# Patient Record
Sex: Male | Born: 1945 | Race: White | Hispanic: No | Marital: Single | Smoking: Former smoker
Health system: Southern US, Community
[De-identification: ages and names within clinical notes are randomized; demographics above are authoritative.]

## PROBLEM LIST (undated history)

## (undated) DIAGNOSIS — R0600 Dyspnea, unspecified: Secondary | ICD-10-CM

## (undated) DIAGNOSIS — G4730 Sleep apnea, unspecified: Secondary | ICD-10-CM

## (undated) DIAGNOSIS — J449 Chronic obstructive pulmonary disease, unspecified: Secondary | ICD-10-CM

## (undated) DIAGNOSIS — E785 Hyperlipidemia, unspecified: Secondary | ICD-10-CM

## (undated) HISTORY — DX: Sleep apnea, unspecified: G47.30

## (undated) HISTORY — DX: Hyperlipidemia, unspecified: E78.5

## (undated) HISTORY — DX: Chronic obstructive pulmonary disease, unspecified: J44.9

## (undated) HISTORY — PX: SHOULDER ARTHROSCOPY: SHX128

## (undated) HISTORY — PX: LIMB SPARING RESECTION HIP W/ SADDLE JOINT REPLACEMENT: SUR829

## (undated) HISTORY — PX: HERNIA REPAIR: SHX51

---

## 2014-04-05 ENCOUNTER — Ambulatory Visit (INDEPENDENT_AMBULATORY_CARE_PROVIDER_SITE_OTHER): Payer: Medicare HMO | Admitting: Otolaryngology

## 2014-04-05 DIAGNOSIS — J342 Deviated nasal septum: Secondary | ICD-10-CM

## 2014-04-05 DIAGNOSIS — J343 Hypertrophy of nasal turbinates: Secondary | ICD-10-CM

## 2014-04-10 ENCOUNTER — Other Ambulatory Visit: Payer: Self-pay | Admitting: Otolaryngology

## 2014-04-24 ENCOUNTER — Encounter (HOSPITAL_BASED_OUTPATIENT_CLINIC_OR_DEPARTMENT_OTHER): Payer: Self-pay

## 2014-04-24 ENCOUNTER — Ambulatory Visit (HOSPITAL_BASED_OUTPATIENT_CLINIC_OR_DEPARTMENT_OTHER): Admit: 2014-04-24 | Payer: Medicare Other | Admitting: Otolaryngology

## 2014-04-24 SURGERY — SEPTOPLASTY, NOSE, WITH NASAL TURBINATE REDUCTION
Anesthesia: General | Laterality: Bilateral

## 2016-11-04 ENCOUNTER — Encounter (INDEPENDENT_AMBULATORY_CARE_PROVIDER_SITE_OTHER): Payer: Self-pay | Admitting: *Deleted

## 2016-11-27 ENCOUNTER — Other Ambulatory Visit (INDEPENDENT_AMBULATORY_CARE_PROVIDER_SITE_OTHER): Payer: Self-pay | Admitting: *Deleted

## 2016-11-27 ENCOUNTER — Encounter (INDEPENDENT_AMBULATORY_CARE_PROVIDER_SITE_OTHER): Payer: Self-pay | Admitting: *Deleted

## 2016-11-27 DIAGNOSIS — Z1211 Encounter for screening for malignant neoplasm of colon: Secondary | ICD-10-CM

## 2017-02-23 ENCOUNTER — Telehealth (INDEPENDENT_AMBULATORY_CARE_PROVIDER_SITE_OTHER): Payer: Self-pay | Admitting: *Deleted

## 2017-02-23 ENCOUNTER — Encounter (INDEPENDENT_AMBULATORY_CARE_PROVIDER_SITE_OTHER): Payer: Self-pay | Admitting: *Deleted

## 2017-02-23 DIAGNOSIS — Z1211 Encounter for screening for malignant neoplasm of colon: Secondary | ICD-10-CM

## 2017-02-23 NOTE — Telephone Encounter (Addendum)
Patient needs trilyte -- screening

## 2017-02-25 MED ORDER — PEG 3350-KCL-NA BICARB-NACL 420 G PO SOLR: 4000.0000 mL | Freq: Once | ORAL | 0 refills | Status: AC

## 2017-03-03 DIAGNOSIS — G894 Chronic pain syndrome: Secondary | ICD-10-CM | POA: Insufficient documentation

## 2017-03-03 NOTE — Progress Notes (Signed)
Patient's Name: Gregory Hardin  MRN: [id number]  Referring Provider: Joyice Faster, FNP  DOB: 1946/01/18  PCP: Joyice Faster, FNP  DOS: 03/04/2017  Note by: Gaspar Cola, MD  Service setting: Ambulatory outpatient  Specialty: Interventional Pain Management  Location: [facility] (AMB) Pain Management Facility  Visit type: Initial Patient Evaluation  Patient type: New Patient   Primary Reason(s) for Visit: Encounter for initial evaluation of one or more chronic problems (new to examiner) potentially causing chronic pain, and posing a threat to normal musculoskeletal function. (Level of risk: High) CC: Back Pain (lower)  HPI  Gregory Hardin is a 71 y.o. year old, male patient, who comes today to see [place] for the first time for an initial evaluation of his chronic pain. 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 Today he comes in for evaluation of his Back Pain (lower)  Pain Assessment: Location: Lower Back Radiating: hips bilaterally and down back of legs to foot- primarily right Onset: More than a month ago Duration: Chronic pain Quality: Numbness, Sore, Aching, Constant, Nagging Severity: 10-Worst pain ever/10 (self-reported pain score)   Note: Reported level is inconsistent with clinical observations. Clinically the patient looks like a 3/10 Information on the proper use of the pain scale provided to the patient today Effect on ADL: cant do what he needs to especially the last year- down 80% of things he used to do- cant walk long distances Timing: Constant Modifying factors: Tylenol-heating pad  Onset and Duration: Present longer than 3 months Cause of pain: other Severity: Getting worse, NAS-11 at its worse: 10/10, NAS-11 at its best: 5/10, NAS-11 now: 10/10 and NAS-11 on the average: 10/10 Timing: Morning, Afternoon, Night, During activity or exercise, After activity or exercise and After a period of immobility Aggravating Factors: Bending, Climbing, Kneeling, Lifiting, Motion, Prolonged sitting, Prolonged standing, Squatting, Stooping , Twisting, Walking, Walking uphill, Walking downhill and Working Alleviating Factors: Hot packs, Lying down and Warm showers or baths Associated Problems: Night-time cramps, Dizziness, Fatigue, Numbness, Sweating, Tingling and Weakness Quality of Pain: Aching, Annoying, Constant, Cramping, Disabling, Exhausting, Nagging and Uncomfortable Previous Examinations or Tests: X-rays Previous Treatments: The patient denies treatments  The patient comes into the clinics today for the first time for a chronic pain management evaluation. According to the patient the primary area of pain is that of the lower back with the right side being worse than the left. He denies any prior surgeries, nerve blocks, physical therapy, or any recent x-rays.  The second area of pain is described to be that of the lower extremity on the right side with pain going all the way down into his foot. He describes that the pain involves the entire foot. He also complains of numbness and weakness of the right lower extremity. The patient indicates having had a right replacement in the past. He denies any physical therapy, nerve  blocks,  joint injections, or recent x-rays.  The next area of pain is described to be that of the left shoulder. He indicates having had a mass removed from his left shoulder in 2017. The surgeon was from [place] in [place]. He denies any prior nerve blocks, joint injections, or x-rays except for having had the joint drained.  The fourth area of pain is described to be to the right knee. He indicates no prior knee surgery, joint injections, nerve blocks, physical therapy, or any recent x-rays.  Physical exam today was positive for bilateral hip pain during the [facility] maneuver. He had significantly decreased range of motion on the right side where he had his total hip replacement. In addition, the patient had a positive bilateral pain provocation on the SI joints with the provocative Patrick's maneuver. The maneuver demonstrated the right side to be worse than the left. In addition to this, the patient had a positive response to the hyperextension or rotation maneuver with pain on the lumbar facets, bilaterally.  Today I took the time to provide the patient with information regarding my pain practice. The patient was informed that my practice is divided into two sections: an interventional pain management section, as well as a completely separate and distinct medication management section. I explained that I have procedure days for my interventional therapies, and evaluation days for follow-ups and medication management. Because of the amount of documentation required during both, they are kept separated. This means that there is the possibility that he may be scheduled for a procedure on one day, and medication management the next. I have also informed him that because of staffing and facility limitations, I no longer take patients for medication management only. To illustrate the reasons for this, I gave the patient the example of surgeons, and how inappropriate it would be to refer a patient  to his/her care, just to write for the post-surgical antibiotics on a surgery done by a different surgeon.   Because interventional pain management is my board-certified specialty, the patient was informed that joining my practice means that they are open to any and all interventional therapies. I made it clear that this does not mean that they will be forced to have any procedures done. What this means is that I believe interventional therapies to be essential part of the diagnosis and proper management of chronic pain conditions. Therefore, patients not interested in these interventional alternatives will be better served under the care of a different practitioner.  The patient was also made aware of my Comprehensive Pain Management Safety Guidelines where by joining my practice, they limit all of their nerve blocks and joint injections to those done by our practice, for as long as we are retained to manage their care.   Historic Controlled Substance Pharmacotherapy Review  PMP and historical list of controlled substances: Oxycodone 5 mg and 10 mg; hydrocodone/APAP 5/325 Highest opioid analgesic regimen found: Oxycodone IR 10 mg 1 tablet by mouth every 6 hours (40 mg/day of oxycodone) (60 MME/Day) Most recent opioid analgesic: Oxycodone IR 10 mg 1 tablet by mouth every 6 hours (40 mg/day of oxycodone) (60 MME/Day) (last prescribed on 05/04/2016) Current opioid analgesics: None Highest recorded MME/day: 60 mg/day MME/day: 0 mg/day Medications: The patient did not bring the medication(s) to the appointment, as requested in our "New Patient Package" Pharmacodynamics: Desired effects: Analgesia: The patient reports >50% benefit. Reported improvement in function: The patient reports medication allows him to accomplish basic ADLs. Clinically meaningful improvement in  function (CMIF): Sustained CMIF goals met Perceived effectiveness: Described as relatively effective, allowing for increase in  activities of daily living (ADL) Undesirable effects: Side-effects or Adverse reactions: None reported Historical Monitoring: The patient  reports that he does not use drugs. List of all UDS Test(s): No results found for: MDMA, COCAINSCRNUR, PCPSCRNUR, PCPQUANT, CANNABQUANT, THCU, [place] List of all Serum Drug Screening Test(s):  No results found for: AMPHSCRSER, BARBSCRSER, BENZOSCRSER, COCAINSCRSER, PCPSCRSER, PCPQUANT, THCSCRSER, CANNABQUANT, OPIATESCRSER, OXYSCRSER, PROPOXSCRSER Historical Background Evaluation: [place] PDMP: Six (6) year initial data search conducted.             [place] Department of public safety, offender search: Editor, commissioning Information) Non-contributory Risk Assessment Profile: Aberrant behavior: None observed or detected today Risk factors for fatal opioid overdose: None identified today Fatal overdose hazard ratio (HR): Calculation deferred Non-fatal overdose hazard ratio (HR): Calculation deferred Risk of opioid abuse or dependence: 0.7-3.0% with doses ? 36 MME/day and 6.1-26% with doses ? 120 MME/day. Substance use disorder (SUD) risk level: Pending results of Medical Psychology Evaluation for SUD Opioid risk tool (ORT) (Total Score):    ORT Scoring interpretation table:  Score <3 = Low Risk for SUD  Score between 4-7 = Moderate Risk for SUD  Score >8 = High Risk for Opioid Abuse   PHQ-2 Depression Scale:  Total score: 0  PHQ-2 Scoring interpretation table: (Score and probability of major depressive disorder)  Score 0 = No depression  Score 1 = 15.4% Probability  Score 2 = 21.1% Probability  Score 3 = 38.4% Probability  Score 4 = 45.5% Probability  Score 5 = 56.4% Probability  Score 6 = 78.6% Probability   PHQ-9 Depression Scale:  Total score: 0  PHQ-9 Scoring interpretation table:  Score 0-4 = No depression  Score 5-9 = Mild depression  Score 10-14 = Moderate depression  Score 15-19 = Moderately severe depression  Score 20-27 = Severe depression (2.4 times  higher risk of SUD and 2.89 times higher risk of overuse)   Pharmacologic Plan: Pending ordered tests and/or consults            Initial impression: Pending review of available data and ordered tests.  Meds   Current Meds  Medication Sig  . acetaminophen (TYLENOL) 500 MG tablet Take 500 mg by mouth every 6 (six) hours as needed.  Marland Kitchen ADVAIR HFA 230-21 MCG/ACT inhaler   . albuterol (PROVENTIL) (2.5 MG/3ML) 0.083% nebulizer solution albuterol sulfate 2.5 mg/3 mL (0.083 %) solution for nebulization  . albuterol (VENTOLIN HFA) 108 (90 Base) MCG/ACT inhaler Ventolin HFA 90 mcg/actuation aerosol inhaler  . fluticasone (FLONASE) 50 MCG/ACT nasal spray fluticasone 50 mcg/actuation nasal spray,suspension  . naloxone (NARCAN) nasal spray 4 mg/0.1 mL Narcan 4 mg/actuation nasal spray  1 spray to 1 nare every 2 minutes (alternating nostrils) until awake./EMS arrives PRN opioid overdose  . pravastatin (PRAVACHOL) 80 MG tablet Take 1 tablet by mouth daily.  . ranitidine (ZANTAC) 150 MG tablet ranitidine 150 mg tablet    Imaging Review   Complexity Note: No results found under the [facility] record.               ROS  Cardiovascular History: No reported cardiovascular signs or symptoms such as High blood pressure, coronary artery disease, abnormal heart rate or rhythm, heart attack, blood thinner therapy or heart weakness and/or failure Pulmonary or Respiratory History: Lung problems, Wheezing and difficulty taking a deep full breath (Asthma), Difficulty blowing air out (Emphysema), Shortness of breath and  Temporary stoppage of breathing during sleep Neurological History: No reported neurological signs or symptoms such as seizures, abnormal skin sensations, urinary and/or fecal incontinence, being born with an abnormal open spine and/or a tethered spinal cord Review of Past Neurological Studies: No results found for this or any previous visit. Psychological-Psychiatric History: No  reported psychological or psychiatric signs or symptoms such as difficulty sleeping, anxiety, depression, delusions or hallucinations (schizophrenial), mood swings (bipolar disorders) or suicidal ideations or attempts Gastrointestinal History: No reported gastrointestinal signs or symptoms such as vomiting or evacuating blood, reflux, heartburn, alternating episodes of diarrhea and constipation, inflamed or scarred liver, or pancreas or irrregular and/or infrequent bowel movements Genitourinary History: No reported renal or genitourinary signs or symptoms such as difficulty voiding or producing urine, peeing blood, non-functioning kidney, kidney stones, difficulty emptying the bladder, difficulty controlling the flow of urine, or chronic kidney disease Hematological History: No reported hematological signs or symptoms such as prolonged bleeding, low or poor functioning platelets, bruising or bleeding easily, hereditary bleeding problems, low energy levels due to low hemoglobin or being anemic Endocrine History: No reported endocrine signs or symptoms such as high or low blood sugar, rapid heart rate due to high thyroid levels, obesity or weight gain due to slow thyroid or thyroid disease Rheumatologic History: No reported rheumatological signs and symptoms such as fatigue, joint pain, tenderness, swelling, redness, heat, stiffness, decreased range of motion, with or without associated rash Musculoskeletal History: Negative for myasthenia gravis, muscular dystrophy, multiple sclerosis or malignant hyperthermia Work History: Retired  Allergies  Mr. Santor has No Known Allergies.  Laboratory Chemistry   Note: No results found under the [facility] record  [facility]  Drug: Mr. Nicolls  reports that he does not use drugs. Alcohol:  reports that he does not drink alcohol. Tobacco:  reports that he has quit smoking. He has never used smokeless tobacco. Medical:  has a past medical  history of COPD (chronic obstructive pulmonary disease) ([place]); Hyperlipidemia; and Sleep apnea. Family: family history includes Alzheimer's disease in his mother; Cancer in his father and sister.  Past Surgical History:  Procedure Laterality Date  . HERNIA REPAIR    . LIMB SPARING RESECTION HIP W/ SADDLE JOINT REPLACEMENT     1977  . SHOULDER ARTHROSCOPY     left   Active Ambulatory Problems    Diagnosis Date Noted  . Special screening for malignant neoplasms, colon 11/27/2016  . Chronic pain syndrome 03/03/2017  . Long term (current) use of opiate analgesic 03/04/2017  . Long term prescription opiate use 03/04/2017  . Opiate use 03/04/2017  . Chronic low back pain (Location of Primary Source of Pain) (Bilateral) (R>L) 03/04/2017  . Chronic lower extremity pain (Location of Secondary source of pain)(Right) 03/04/2017  . Chronic Lumbar radicular pain (Right) (L5/S1) 03/04/2017  . Chronic lumbar sensory radiculopathy (Right) 03/04/2017  . Lower extremity weakness (Right) 03/04/2017  . Lower extremity numbness (Right) 03/04/2017  . 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 Chronic hip pain (Bilateral) (R>L) 03/04/2017  . Chronic shoulder pain [facility] of pain) (Left) 03/04/2017  . Chronic knee pain (Right) 03/04/2017  . Lumbar facet syndrome (Bilateral) (R>L) 03/04/2017  . Chronic sacroiliac joint pain (Bilateral) (R>L) 03/04/2017  . History of total right hip arthroplasty 03/04/2017   Resolved Ambulatory Problems    Diagnosis Date Noted  . No Resolved Ambulatory Problems   Past Medical History:  Diagnosis Date  . COPD (chronic obstructive pulmonary disease) ([place])   .  Hyperlipidemia   . Sleep apnea    Constitutional Exam  General appearance: Well nourished, well developed, and well hydrated. In no apparent acute distress Vitals:   03/04/17 1030  BP: (!) 142/83  Pulse: 69  Resp: 16  Temp: 97.6 F (36.4 C)  SpO2: 97%  Weight: 162 lb (73.5 kg)   Height: 6' (1.829 m)   BMI Assessment: Estimated body mass index is 21.97 kg/m as calculated from the following:   Height as of this encounter: 6' (1.829 m).   Weight as of this encounter: 162 lb (73.5 kg).  BMI interpretation table: BMI level Category Range association with higher incidence of chronic pain  <18 kg/m2 Underweight   18.5-24.9 kg/m2 Ideal body weight   25-29.9 kg/m2 Overweight Increased incidence by 20%  30-34.9 kg/m2 Obese (Class I) Increased incidence by 68%  35-39.9 kg/m2 Severe obesity (Class II) Increased incidence by 136%  >40 kg/m2 Extreme obesity (Class III) Increased incidence by 254%   BMI Readings from Last 4 Encounters:  03/04/17 21.97 kg/m   Wt Readings from Last 4 Encounters:  03/04/17 162 lb (73.5 kg)  Psych/Mental status: Alert, oriented x 3 (person, place, & time)       Eyes: PERLA Respiratory: No evidence of acute respiratory distress  Cervical Spine Exam  Inspection: No masses, redness, or swelling Alignment: Symmetrical Functional ROM: Unrestricted ROM      Stability: No instability detected Muscle strength & Tone: Functionally intact Sensory: Unimpaired Palpation: No palpable anomalies              Upper Extremity (UE) Exam    Side: Right upper extremity  Side: Left upper extremity  Inspection: No masses, redness, swelling, or asymmetry. No contractures  Inspection: No masses, redness, swelling, or asymmetry. No contractures  Functional ROM: Unrestricted ROM          Functional ROM: Unrestricted ROM          Muscle strength & Tone: Functionally intact  Muscle strength & Tone: Functionally intact  Sensory: Unimpaired  Sensory: Unimpaired  Palpation: No palpable anomalies              Palpation: No palpable anomalies              Specialized Test(s): Deferred         Specialized Test(s): Deferred          Thoracic Spine Exam  Inspection: No masses, redness, or swelling Alignment: Symmetrical Functional ROM: Unrestricted  ROM Stability: No instability detected Sensory: Unimpaired Muscle strength & Tone: No palpable anomalies  Lumbar Spine Exam  Inspection: No masses, redness, or swelling Alignment: Symmetrical Functional ROM: Minimal ROM      Stability: No instability detected Muscle strength & Tone: Functionally intact Sensory: Movement-associated pain Palpation: Complains of area being tender to palpation       Provocative Tests: Lumbar Hyperextension and rotation test: Positive bilaterally for facet joint pain. Patrick's Maneuver: Positive for bilateral S-I arthralgia and for bilateral hip arthralgia  Gait & Posture Assessment  Ambulation: Limited Gait: Antalgic Posture: Painful   Lower Extremity Exam    Side: Right lower extremity  Side: Left lower extremity  Inspection: No masses, redness, swelling, or asymmetry. No contractures  Inspection: No masses, redness, swelling, or asymmetry. No contractures  Functional ROM: Limited ROM for hip joint  Functional ROM: Unrestricted ROM for hip joint  Muscle strength & Tone: Functionally intact  Muscle strength & Tone: Functionally intact  Sensory: Movement-associated pain  Sensory: Movement-associated pain  Palpation: No palpable anomalies  Palpation: No palpable anomalies   Assessment  Primary Diagnosis & Pertinent Problem List: The primary encounter diagnosis was Chronic low back pain (Location of Primary Source of Pain) (Bilateral) (R>L). 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  Visit Diagnosis (New problems to examiner): 1. Chronic low back pain (Location of Primary Source of Pain) (Bilateral) (R>L)   2. Chronic lower extremity pain (Location of Secondary source of pain)(Right)   3. Chronic shoulder pain (Left)   4. Lumbar facet syndrome (Bilateral) (R>L)   5. Chronic sacroiliac joint pain (Bilateral) (R>L)   6. Chronic Lumbar radicular pain (Right) (L5/S1)   7. Chronic lumbar sensory radiculopathy (Right)   8. Weakness of right lower extremity   9. Numbness and tingling of right lower extremity   10. 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 Chronic hip pain (Bilateral) (R>L)   12. History of total right hip arthroplasty   13. Chronic knee pain (Right)   14. Chronic pain syndrome   15. Long term (current) use of opiate analgesic   16. Long term prescription opiate use   17. Opiate use    Plan of Care (Initial workup plan)  Note: Please be advised that as per protocol, today's visit has been an evaluation only. We have not taken over the patient's controlled substance management.  Problem-specific plan: No problem-specific Assessment & Plan notes found for this encounter.  Ordered Lab-work, Procedure(s), Referral(s), & Consult(s): Orders Placed This Encounter  Procedures  . DG HIP UNILAT W OR W/O PELVIS 2-3 VIEWS LEFT  . DG HIP UNILAT W OR W/O PELVIS 2-3 VIEWS RIGHT  . DG Knee 1-2 Views Right  . DG Lumbar Spine Complete W/Bend  . DG Si Joints  . DG Shoulder Left  . Compliance Drug Analysis, Ur  . Comprehensive metabolic panel  . C-reactive protein  . Sedimentation rate  . Magnesium  . 25-Hydroxyvitamin D Lcms D2+D3  . Vitamin B12  . Ambulatory referral to Psychology  . NCV with EMG(electromyography)  . Somatosensory test   Pharmacotherapy (current): Medications  ordered:  No orders of the defined types were placed in this encounter.  Medications administered during this visit: Mr. Cayson had no medications administered during this visit.   Pharmacological management options:  Opioid Analgesics: The patient was informed that there is no guarantee that he would be a candidate for opioid analgesics. The decision will be made following CDC guidelines. This decision will be based on the results of diagnostic studies, as well as Mr. Tibbitts's risk profile.   Membrane stabilizer: To be determined at a later time  Muscle relaxant: To be determined at a later time  NSAID: To be determined at a later time  Other analgesic(s): To be determined at a later time   Interventional management options: Mr. Huegel was informed that there is no guarantee that he would be a candidate for interventional therapies. The decision will be based on the  results of diagnostic studies, as well as Mr. Tvedt's risk profile.  Procedure(s) under consideration:  Diagnostic bilateral lumbar facet block  Possible bilateral lumbar facet RFA  Diagnostic bilateral sacroiliac joint block  Possible bilateral sacroiliac joint RFA  Diagnostic left intra-articular hip joint injection  Diagnostic bilateral femoral nerve and obturator nerve block  Possible bilateral femoral nerve and optic obturator nerve RFA  Diagnostic right intra-articular knee joint injection  Possible right sided series of 5 intra-articular [place] knee injections  Diagnostic right Genicular nerve block  Possible right Genicular nerve RFA  Diagnostic right L4-5 lumbar epidural steroid injection  Diagnostic left intra-articular shoulder joint injection  Diagnostic left suprascapular nerve block  Possible left suprascapular nerve RFA    Provider-requested follow-up: Return for 2nd Visit, after MedPsych eval.  No future appointments.  Primary Care Physician: Joyice Faster, FNP Location: [facility]  Pain Management Facility Note by: Gaspar Cola, MD Date: 03/04/2017; Time: 11:47 AM  Patient Instructions    ____________________________________________________________________________________________  Appointment Policy Summary  It is our goal and responsibility to provide the medical community with assistance in the evaluation and management of patients with chronic pain. Unfortunately our resources are limited. Because we do not have an unlimited amount of time, or available appointments, we are required to closely monitor and manage their use. The following rules exist to maximize their use:  Patient's responsibilities: 1. Punctuality:  At what time should I arrive? You should be physically present in our office 30 minutes before your scheduled appointment. Your scheduled appointment is with your assigned healthcare provider. However, it takes 5-10 minutes to be "checked-in", and another 15 minutes for the nurses to do the admission. If you arrive to our office at the time you were given for your appointment, you will end up being at least 20-25 minutes late to your appointment with the provider. 2. Tardiness:  What happens if I arrive only a few minutes after my scheduled appointment time? You will need to reschedule your appointment. The cutoff is your appointment time. This is why it is so important that you arrive at least 30 minutes before that appointment. If you have an appointment scheduled for 10:00 AM and you arrive at 10:01, you will be required to reschedule your appointment.  3. Plan ahead:  Always assume that you will encounter traffic on your way in. Plan for it. If you are dependent on a driver, make sure they understand these rules and the need to arrive early. 4. Other appointments and responsibilities:  Avoid scheduling any other appointments before or after your pain clinic appointments.  5. Be prepared:  Write down everything that you need to discuss with your  healthcare provider and give this information to the admitting nurse. Write down the medications that you will need refilled. Bring your pills and bottles (even the empty ones), to all of your appointments, except for those where a procedure is scheduled. 6. No children or pets:  Find someone to take care of them. It is not appropriate to bring them in. 7. Scheduling changes:  We request "advanced notification" of any changes or cancellations. 8. Advanced notification:  Defined as a time period of more than 24 hours prior to the originally scheduled appointment. This allows for the appointment to be offered to other patients. 9. Rescheduling:  When a visit is rescheduled, it will require the cancellation of the original appointment. For this reason they both fall within the category of "Cancellations".  10. Cancellations:  They  require advanced notification. Any cancellation less than 24 hours before the  appointment will be recorded as a "No Show". 11. No Show:  Defined as an unkept appointment where the patient failed to notify or declare to the practice their intention or inability to keep the appointment.  Corrective process for repeat offenders:  1. Tardiness: Three (3) episodes of rescheduling due to late arrivals will be recorded as one (1) "No Show". 2. Cancellation or reschedule: Three (3) cancellations or rescheduling will be recorded as one (1) "No Show". 3. "No Shows": Three (3) "No Shows" within a 12 month period will result in discharge from the practice.  ____________________________________________________________________________________________  ____________________________________________________________________________________________  Pain Scale  Introduction: The pain score used by this practice is the Verbal Numerical Rating Scale (VNRS-11). This is an 11-point scale. It is for adults and children 10 years or older. There are significant differences in how the pain  score is reported, used, and applied. Forget everything you learned in the past and learn this scoring system.  General Information: The scale should reflect your current level of pain. Unless you are specifically asked for the level of your worst pain, or your average pain. If you are asked for one of these two, then it should be understood that it is over the past 24 hours.  Basic Activities of Daily Living (ADL): Personal hygiene, dressing, eating, transferring, and using restroom.  Instructions: Most patients tend to report their level of pain as a combination of two factors, their physical pain and their psychosocial pain. This last one is also known as "suffering" and it is reflection of how physical pain affects you socially and psychologically. From now on, report them separately. From this point on, when asked to report your pain level, report only your physical pain. Use the following table for reference.  Pain Clinic Pain Levels (0-5/10)  Pain Level Score  Description  No Pain 0   Mild pain 1 Nagging, annoying, but does not interfere with basic activities of daily living (ADL). Patients are able to eat, bathe, get dressed, toileting (being able to get on and off the toilet and perform personal hygiene functions), transfer (move in and out of bed or a chair without assistance), and maintain continence (able to control bladder and bowel functions). Blood pressure and heart rate are unaffected. A normal heart rate for a healthy adult ranges from 60 to 100 bpm (beats per minute).   Mild to moderate pain 2 Noticeable and distracting. Impossible to hide from other people. More frequent flare-ups. Still possible to adapt and function close to normal. It can be very annoying and may have occasional stronger flare-ups. With discipline, patients may get used to it and adapt.   Moderate pain 3 Interferes significantly with activities of daily living (ADL). It becomes difficult to feed, bathe, get  dressed, get on and off the toilet or to perform personal hygiene functions. Difficult to get in and out of bed or a chair without assistance. Very distracting. With effort, it can be ignored when deeply involved in activities.   Moderately severe pain 4 Impossible to ignore for more than a few minutes. With effort, patients may still be able to manage work or participate in some social activities. Very difficult to concentrate. Signs of autonomic nervous system discharge are evident: dilated pupils (mydriasis); mild sweating (diaphoresis); sleep interference. Heart rate becomes elevated (>115 bpm). Diastolic blood pressure (lower number) rises above 100 mmHg. Patients find relief in laying down and not moving.  Severe pain 5 Intense and extremely unpleasant. Associated with frowning face and frequent crying. Pain overwhelms the senses.  Ability to do any activity or maintain social relationships becomes significantly limited. Conversation becomes difficult. Pacing back and forth is common, as getting into a comfortable position is nearly impossible. Pain wakes you up from deep sleep. Physical signs will be obvious: pupillary dilation; increased sweating; goosebumps; brisk reflexes; cold, clammy hands and feet; nausea, vomiting or dry heaves; loss of appetite; significant sleep disturbance with inability to fall asleep or to remain asleep. When persistent, significant weight loss is observed due to the complete loss of appetite and sleep deprivation.  Blood pressure and heart rate becomes significantly elevated. Caution: If elevated blood pressure triggers a pounding headache associated with blurred vision, then the patient should immediately seek attention at an urgent or emergency care unit, as these may be signs of an impending stroke.    Emergency Department Pain Levels (6-10/10)  Emergency Room Pain 6 Severely limiting. Requires emergency care and should not be seen or managed at an outpatient pain  management facility. Communication becomes difficult and requires great effort. Assistance to reach the emergency department may be required. Facial flushing and profuse sweating along with potentially dangerous increases in heart rate and blood pressure will be evident.   Distressing pain 7 Self-care is very difficult. Assistance is required to transport, or use restroom. Assistance to reach the emergency department will be required. Tasks requiring coordination, such as bathing and getting dressed become very difficult.   Disabling pain 8 Self-care is no longer possible. At this level, pain is disabling. The individual is unable to do even the most "basic" activities such as walking, eating, bathing, dressing, transferring to a bed, or toileting. Fine motor skills are lost. It is difficult to think clearly.   Incapacitating pain 9 Pain becomes incapacitating. Thought processing is no longer possible. Difficult to remember your own name. Control of movement and coordination are lost.   The worst pain imaginable 10 At this level, most patients pass out from pain. When this level is reached, collapse of the autonomic nervous system occurs, leading to a sudden drop in blood pressure and heart rate. This in turn results in a temporary and dramatic drop in blood flow to the brain, leading to a loss of consciousness. Fainting is one of the body's self defense mechanisms. Passing out puts the brain in a calmed state and causes it to shut down for a while, in order to begin the healing process.    Summary: 1. Refer to this scale when providing [place] with your pain level. 2. Be accurate and careful when reporting your pain level. This will help with your care. 3. Over-reporting your pain level will lead to loss of credibility. 4. Even a level of 1/10 means that there is pain and will be treated at our facility. 5. High, inaccurate reporting will be documented as "Symptom Exaggeration", leading to loss of  credibility and suspicions of possible secondary gains such as obtaining more narcotics, or wanting to appear disabled, for fraudulent reasons. 6. Only pain levels of 5 or below will be seen at our facility. 7. Pain levels of 6 and above will be sent to the Emergency Department and the appointment cancelled. ____________________________________________________________________________________________

## 2017-03-04 ENCOUNTER — Encounter: Payer: Self-pay | Admitting: Pain Medicine

## 2017-03-04 ENCOUNTER — Ambulatory Visit
Admission: RE | Admit: 2017-03-04 | Discharge: 2017-03-04 | Disposition: A | Discharge status: Home / Self Care | Payer: Medicare Other | Source: Ambulatory Visit | Attending: Pain Medicine | Admitting: Pain Medicine

## 2017-03-04 ENCOUNTER — Ambulatory Visit: Payer: Medicare Other | Attending: Pain Medicine | Admitting: Pain Medicine

## 2017-03-04 ENCOUNTER — Telehealth (INDEPENDENT_AMBULATORY_CARE_PROVIDER_SITE_OTHER): Payer: Self-pay | Admitting: *Deleted

## 2017-03-04 ENCOUNTER — Other Ambulatory Visit: Payer: Self-pay | Admitting: Pain Medicine

## 2017-03-04 VITALS — BP 142/83 | HR 69 | Temp 97.6°F | Resp 16 | Ht 72.0 in | Wt 162.0 lb

## 2017-03-04 DIAGNOSIS — M533 Sacrococcygeal disorders, not elsewhere classified: Secondary | ICD-10-CM

## 2017-03-04 DIAGNOSIS — Z96641 Presence of right artificial hip joint: Secondary | ICD-10-CM | POA: Insufficient documentation

## 2017-03-04 DIAGNOSIS — M25561 Pain in right knee: Secondary | ICD-10-CM

## 2017-03-04 DIAGNOSIS — G4730 Sleep apnea, unspecified: Secondary | ICD-10-CM | POA: Diagnosis not present

## 2017-03-04 DIAGNOSIS — R29898 Other symptoms and signs involving the musculoskeletal system: Secondary | ICD-10-CM | POA: Insufficient documentation

## 2017-03-04 DIAGNOSIS — R200 Anesthesia of skin: Secondary | ICD-10-CM

## 2017-03-04 DIAGNOSIS — R531 Weakness: Secondary | ICD-10-CM | POA: Diagnosis not present

## 2017-03-04 DIAGNOSIS — M25512 Pain in left shoulder: Secondary | ICD-10-CM | POA: Insufficient documentation

## 2017-03-04 DIAGNOSIS — M5441 Lumbago with sciatica, right side: Secondary | ICD-10-CM

## 2017-03-04 DIAGNOSIS — M25551 Pain in right hip: Secondary | ICD-10-CM | POA: Diagnosis not present

## 2017-03-04 DIAGNOSIS — R202 Paresthesia of skin: Secondary | ICD-10-CM

## 2017-03-04 DIAGNOSIS — M545 Low back pain: Secondary | ICD-10-CM | POA: Insufficient documentation

## 2017-03-04 DIAGNOSIS — M25559 Pain in unspecified hip: Secondary | ICD-10-CM | POA: Insufficient documentation

## 2017-03-04 DIAGNOSIS — M79604 Pain in right leg: Secondary | ICD-10-CM

## 2017-03-04 DIAGNOSIS — Z9889 Other specified postprocedural states: Secondary | ICD-10-CM | POA: Insufficient documentation

## 2017-03-04 DIAGNOSIS — Z79891 Long term (current) use of opiate analgesic: Secondary | ICD-10-CM

## 2017-03-04 DIAGNOSIS — Z9641 Presence of insulin pump (external) (internal): Secondary | ICD-10-CM | POA: Insufficient documentation

## 2017-03-04 DIAGNOSIS — M25552 Pain in left hip: Secondary | ICD-10-CM | POA: Diagnosis not present

## 2017-03-04 DIAGNOSIS — F1190 Opioid use, unspecified, uncomplicated: Secondary | ICD-10-CM | POA: Diagnosis not present

## 2017-03-04 DIAGNOSIS — G8929 Other chronic pain: Secondary | ICD-10-CM | POA: Insufficient documentation

## 2017-03-04 DIAGNOSIS — J449 Chronic obstructive pulmonary disease, unspecified: Secondary | ICD-10-CM | POA: Insufficient documentation

## 2017-03-04 DIAGNOSIS — G894 Chronic pain syndrome: Secondary | ICD-10-CM | POA: Insufficient documentation

## 2017-03-04 DIAGNOSIS — M1612 Unilateral primary osteoarthritis, left hip: Secondary | ICD-10-CM | POA: Diagnosis not present

## 2017-03-04 DIAGNOSIS — M5410 Radiculopathy, site unspecified: Secondary | ICD-10-CM | POA: Diagnosis not present

## 2017-03-04 DIAGNOSIS — M4696 Unspecified inflammatory spondylopathy, lumbar region: Secondary | ICD-10-CM | POA: Insufficient documentation

## 2017-03-04 DIAGNOSIS — Z79899 Other long term (current) drug therapy: Secondary | ICD-10-CM | POA: Insufficient documentation

## 2017-03-04 DIAGNOSIS — M47816 Spondylosis without myelopathy or radiculopathy, lumbar region: Secondary | ICD-10-CM

## 2017-03-04 DIAGNOSIS — Z87891 Personal history of nicotine dependence: Secondary | ICD-10-CM | POA: Insufficient documentation

## 2017-03-04 DIAGNOSIS — E785 Hyperlipidemia, unspecified: Secondary | ICD-10-CM | POA: Diagnosis not present

## 2017-03-04 DIAGNOSIS — M5416 Radiculopathy, lumbar region: Secondary | ICD-10-CM | POA: Diagnosis not present

## 2017-03-04 NOTE — Telephone Encounter (Signed)
agree

## 2017-03-04 NOTE — Patient Instructions (Signed)

## 2017-03-04 NOTE — Progress Notes (Signed)
Safety precautions to be maintained throughout the outpatient stay will include: orient to surroundings, keep bed in low position, maintain call bell within reach at all times, provide assistance with transfer out of bed and ambulation.  

## 2017-03-04 NOTE — Telephone Encounter (Signed)
Referring MD/PCP: Burman Freestone, np -- [facility]   Procedure: tcs  Reason/Indication:  screening  Has patient had this procedure before?  Yes, over 10 yrs ago  If so, when, by whom and where?    Is there a family history of colon cancer?  no  Who?  What age when diagnosed?    Is patient diabetic?   no      Does patient have prosthetic heart valve or mechanical valve? no  Do you have a pacemaker?  no  Has patient ever had endocarditis? no  Has patient had joint replacement within last 12 months?  no  Does patient tend to be constipated or take laxatives? no  Does patient have a history of alcohol/drug use?  no  Is patient on Coumadin, Plavix and/or Aspirin? no  Medications: pravastatin 80 mg daily, fluticasone nasal spray, rantidiine 150 mg bid, ventolin 2 puffs every 4 hrs, advair2 puffs bid, nebulizer tid prn, vit b12 daily  Allergies: nkda  Medication Adjustment per Dr Laural Golden:   Procedure date & time: 03/31/17 at 930

## 2017-03-10 LAB — COMPLIANCE DRUG ANALYSIS, UR

## 2017-03-24 LAB — COMPREHENSIVE METABOLIC PANEL
A/G RATIO: 1.6 (ref 1.2–2.2)
ALT: 17 IU/L (ref 0–44)
AST: 17 IU/L (ref 0–40)
Albumin: 4.5 g/dL (ref 3.5–4.8)
Alkaline Phosphatase: 52 IU/L (ref 39–117)
BUN/Creatinine Ratio: 11 (ref 10–24)
BUN: 9 mg/dL (ref 8–27)
Bilirubin Total: 0.3 mg/dL (ref 0.0–1.2)
CALCIUM: 9.6 mg/dL (ref 8.6–10.2)
CO2: 23 mmol/L (ref 20–29)
Chloride: 99 mmol/L (ref 96–106)
Creatinine, Ser: 0.81 mg/dL (ref 0.76–1.27)
GFR, EST AFRICAN AMERICAN: 103 mL/min/{1.73_m2} (ref >59)
GFR, EST NON AFRICAN AMERICAN: 89 mL/min/{1.73_m2} (ref >59)
GLOBULIN, TOTAL: 2.8 g/dL (ref 1.5–4.5)
Glucose: 85 mg/dL (ref 65–99)
POTASSIUM: 4.4 mmol/L (ref 3.5–5.2)
SODIUM: 139 mmol/L (ref 134–144)
Total Protein: 7.3 g/dL (ref 6.0–8.5)

## 2017-03-24 LAB — SEDIMENTATION RATE: Sed Rate: 4 mm/hr (ref 0–30)

## 2017-03-24 LAB — 25-HYDROXY VITAMIN D LCMS D2+D3
25-Hydroxy, Vitamin D-3: 40 ng/mL
25-Hydroxy, Vitamin D: 40 ng/mL

## 2017-03-24 LAB — 25-HYDROXYVITAMIN D LCMS D2+D3

## 2017-03-24 LAB — VITAMIN B12: Vitamin B-12: 1393 pg/mL — ABNORMAL HIGH (ref 232–1245)

## 2017-03-24 LAB — MAGNESIUM: MAGNESIUM: 2.1 mg/dL (ref 1.6–2.3)

## 2017-03-24 LAB — C-REACTIVE PROTEIN: CRP: 9.4 mg/L — AB (ref 0.0–4.9)

## 2017-03-31 ENCOUNTER — Encounter (HOSPITAL_COMMUNITY)
Admission: RE | Disposition: A | Discharge status: Home / Self Care | Payer: Self-pay | Source: Ambulatory Visit | Attending: Internal Medicine

## 2017-03-31 ENCOUNTER — Ambulatory Visit (HOSPITAL_COMMUNITY)
Admission: RE | Admit: 2017-03-31 | Discharge: 2017-03-31 | Disposition: A | Discharge status: Home / Self Care | Payer: Medicare Other | Source: Ambulatory Visit | Attending: Internal Medicine | Admitting: Internal Medicine

## 2017-03-31 ENCOUNTER — Encounter (HOSPITAL_COMMUNITY): Payer: Self-pay | Admitting: *Deleted

## 2017-03-31 DIAGNOSIS — Z9989 Dependence on other enabling machines and devices: Secondary | ICD-10-CM | POA: Insufficient documentation

## 2017-03-31 DIAGNOSIS — J449 Chronic obstructive pulmonary disease, unspecified: Secondary | ICD-10-CM | POA: Insufficient documentation

## 2017-03-31 DIAGNOSIS — K621 Rectal polyp: Secondary | ICD-10-CM | POA: Insufficient documentation

## 2017-03-31 DIAGNOSIS — Z1211 Encounter for screening for malignant neoplasm of colon: Secondary | ICD-10-CM | POA: Diagnosis not present

## 2017-03-31 DIAGNOSIS — Z79899 Other long term (current) drug therapy: Secondary | ICD-10-CM | POA: Diagnosis not present

## 2017-03-31 DIAGNOSIS — K5730 Diverticulosis of large intestine without perforation or abscess without bleeding: Secondary | ICD-10-CM | POA: Insufficient documentation

## 2017-03-31 DIAGNOSIS — Z7951 Long term (current) use of inhaled steroids: Secondary | ICD-10-CM | POA: Insufficient documentation

## 2017-03-31 DIAGNOSIS — G4730 Sleep apnea, unspecified: Secondary | ICD-10-CM | POA: Diagnosis not present

## 2017-03-31 DIAGNOSIS — K644 Residual hemorrhoidal skin tags: Secondary | ICD-10-CM

## 2017-03-31 DIAGNOSIS — D120 Benign neoplasm of cecum: Secondary | ICD-10-CM | POA: Insufficient documentation

## 2017-03-31 DIAGNOSIS — E785 Hyperlipidemia, unspecified: Secondary | ICD-10-CM | POA: Diagnosis not present

## 2017-03-31 DIAGNOSIS — Z87891 Personal history of nicotine dependence: Secondary | ICD-10-CM | POA: Insufficient documentation

## 2017-03-31 HISTORY — PX: COLONOSCOPY: SHX5424

## 2017-03-31 HISTORY — DX: Dyspnea, unspecified: R06.00

## 2017-03-31 SURGERY — COLONOSCOPY
Anesthesia: Moderate Sedation

## 2017-03-31 MED ORDER — MEPERIDINE HCL 50 MG/ML IJ SOLN
INTRAMUSCULAR | Status: AC
  Filled 2017-03-31: qty 1

## 2017-03-31 MED ORDER — MIDAZOLAM HCL 5 MG/5ML IJ SOLN
INTRAMUSCULAR | Status: DC | PRN
  Administered 2017-03-31: 2 mg via INTRAVENOUS
  Administered 2017-03-31 (×3): 1 mg via INTRAVENOUS

## 2017-03-31 MED ORDER — STERILE WATER FOR IRRIGATION IR SOLN
Status: DC | PRN
  Administered 2017-03-31: 09:00:00

## 2017-03-31 MED ORDER — MIDAZOLAM HCL 5 MG/5ML IJ SOLN
INTRAMUSCULAR | Status: AC
  Filled 2017-03-31: qty 10

## 2017-03-31 MED ORDER — MEPERIDINE HCL 50 MG/ML IJ SOLN
INTRAMUSCULAR | Status: DC | PRN
  Administered 2017-03-31 (×2): 25 mg

## 2017-03-31 MED ORDER — SODIUM CHLORIDE 0.9 % IV SOLN
INTRAVENOUS | Status: DC
  Administered 2017-03-31: 09:00:00 via INTRAVENOUS

## 2017-03-31 NOTE — Discharge Instructions (Signed)
No aspirin or NSAIDs for 24 hours. Resume usual medications and high fiber diet. No driving for 24 hours. Physician will call with biopsy results.   Colonoscopy, Adult, Care After This sheet gives you information about how to care for yourself after your procedure. Your health care provider may also give you more specific instructions. If you have problems or questions, contact your health care provider. What can I expect after the procedure? After the procedure, it is common to have:  A small amount of blood in your stool for 24 hours after the procedure.  Some gas.  Mild abdominal cramping or bloating.  Follow these instructions at home: General instructions   For the first 24 hours after the procedure: ? Do not drive or use machinery. ? Do not sign important documents. ? Do not drink alcohol. ? Do your regular daily activities at a slower pace than normal. ? Eat soft, easy-to-digest foods. ? Rest often.  Take over-the-counter or prescription medicines only as told by your health care provider.  It is up to you to get the results of your procedure. Ask your health care provider, or the department performing the procedure, when your results will be ready. Relieving cramping and bloating  Try walking around when you have cramps or feel bloated.  Apply heat to your abdomen as told by your health care provider. Use a heat source that your health care provider recommends, such as a moist heat pack or a heating pad. ? Place a towel between your skin and the heat source. ? Leave the heat on for 20-30 minutes. ? Remove the heat if your skin turns bright red. This is especially important if you are unable to feel pain, heat, or cold. You may have a greater risk of getting burned. Eating and drinking  Drink enough fluid to keep your urine clear or pale yellow.  Resume your normal diet as instructed by your health care provider. Avoid heavy or fried foods that are hard to  digest.  Avoid drinking alcohol for as long as instructed by your health care provider. Contact a health care provider if:  You have blood in your stool 2-3 days after the procedure. Get help right away if:  You have more than a small spotting of blood in your stool.  You pass large blood clots in your stool.  Your abdomen is swollen.  You have nausea or vomiting.  You have a fever.  You have increasing abdominal pain that is not relieved with medicine. This information is not intended to replace advice given to you by your health care provider. Make sure you discuss any questions you have with your health care provider. Document Released: 03/24/2004 Document Revised: 05/04/2016 Document Reviewed: 10/22/2015 Elsevier Interactive Patient Education  [employer].

## 2017-03-31 NOTE — Op Note (Signed)
[facility] Patient Name: Gregory Hardin Procedure Date: 03/31/2017 8:35 AM MRN: [id number] Date of Birth: 08/14/1946 Attending MD: Hildred Laser , MD CSN: [id number] Age: 71 Admit Type: Outpatient Procedure:                Colonoscopy Indications:              Screening for colorectal malignant neoplasm Providers:                Hildred Laser, MD, Janeece Riggers, RN, Rosina Lowenstein, RN Referring MD:             Burman Freestone, FNP Medicines:                Meperidine 50 mg IV, Midazolam 5 mg IV Complications:            No immediate complications. Estimated Blood Loss:     Estimated blood loss was minimal. Procedure:                Pre-Anesthesia Assessment:                           - Prior to the procedure, a History and Physical                            was performed, and patient medications and                            allergies were reviewed. The patient's tolerance of                            previous anesthesia was also reviewed. The risks                            and benefits of the procedure and the sedation                            options and risks were discussed with the patient.                            All questions were answered, and informed consent                            was obtained. Prior Anticoagulants: The patient has                            taken no previous anticoagulant or antiplatelet                            agents. ASA Grade Assessment: II - A patient with                            mild systemic disease. After reviewing the risks                            and benefits, the patient was deemed in  satisfactory condition to undergo the procedure.                           After obtaining informed consent, the colonoscope                            was passed under direct vision. Throughout the                            procedure, the patient's blood pressure, pulse, and                            oxygen  saturations were monitored continuously. The                            EC-[id number] ([id number]) scope was introduced through                            the anus and advanced to the the cecum, identified                            by appendiceal orifice and ileocecal valve. The                            colonoscopy was somewhat difficult due to                            significant looping. Successful completion of the                            procedure was aided by changing the patient to a                            supine position, using manual pressure and                            withdrawing and reinserting the scope. The patient                            tolerated the procedure well. The quality of the                            bowel preparation was excellent. The ileocecal                            valve, appendiceal orifice, and rectum were                            photographed. Scope In: 9:17:18 AM Scope Out: 9:46:27 AM Scope Withdrawal Time: 0 hours 18 minutes 40 seconds  Total Procedure Duration: 0 hours 29 minutes 9 seconds  Findings:      The perianal and digital rectal examinations were normal.      Two sessile polyps were found in the rectum and cecum. The polyps  were 4       mm in size. These polyps were removed with a cold snare. Resection and       retrieval were complete. The pathology specimen was placed into Bottle       Number 1.      A 3 mm polyp was found in the cecum. The polyp was sessile. Biopsies       were taken with a cold forceps for histology. The pathology specimen was       placed into Bottle Number 1.      Scattered medium-mouthed diverticula were found in the sigmoid colon.      External hemorrhoids were found during retroflexion. The hemorrhoids       were small. Impression:               - Two 4 mm polyps in the rectum and in the cecum,                            removed with a cold snare. Resected and retrieved.                           -  One 3 mm polyp in the cecum. Biopsied.                           - Diverticulosis in the sigmoid colon.                           - External hemorrhoids. Moderate Sedation:      Moderate (conscious) sedation was administered by the endoscopy nurse       and supervised by the endoscopist. The following parameters were       monitored: oxygen saturation, heart rate, blood pressure, CO2       capnography and response to care. Total physician intraservice time was       33 minutes. Recommendation:           - Patient has a contact number available for                            emergencies. The signs and symptoms of potential                            delayed complications were discussed with the                            patient. Return to normal activities tomorrow.                            Written discharge instructions were provided to the                            patient.                           - High fiber diet today.                           - No aspirin, ibuprofen, naproxen, or  other                            non-steroidal anti-inflammatory drugs for 1 day.                           - Await pathology results.                           - Repeat colonoscopy date to be determined after                            pending pathology results are reviewed. Procedure Code(s):        --- Professional ---                           [phone], Colonoscopy, flexible; with removal of                            tumor(s), polyp(s), or other lesion(s) by snare                            technique                           45380, 59, Colonoscopy, flexible; with biopsy,                            single or multiple                           99152, Moderate sedation services provided by the                            same physician or other qualified health care                            professional performing the diagnostic or                            therapeutic service that the sedation  supports,                            requiring the presence of an independent trained                            observer to assist in the monitoring of the                            patient's level of consciousness and physiological                            status; initial 15 minutes of intraservice time,                            patient age 12 years or older  [address], Moderate sedation services; each additional                            15 minutes intraservice time Diagnosis Code(s):        --- Professional ---                           Z12.11, Encounter for screening for malignant                            neoplasm of colon                           K62.1, Rectal polyp                           D12.0, Benign neoplasm of cecum                           K64.4, Residual hemorrhoidal skin tags                           K57.30, Diverticulosis of large intestine without                            perforation or abscess without bleeding CPT copyright 2016 American Medical Association. All rights reserved. The codes documented in this report are preliminary and upon coder review may  be revised to meet current compliance requirements. Hildred Laser, MD Hildred Laser, MD 03/31/2017 9:54:22 AM This report has been signed electronically. Number of Addenda: 0

## 2017-03-31 NOTE — H&P (Signed)
Gregory Hardin is an 71 y.o. male.   Chief Complaint: Patient is here for colonoscopy. HPI: Patient is 71 year old Caucasian male who is here for screening colonoscopy. Last exam was 10 years ago. Patient denies abdominal pain change in bowel habits or rectal bleeding. Family history is negative for CRC.  Past Medical History:  Diagnosis Date  . COPD (chronic obstructive pulmonary disease) ([place])       . Hyperlipidemia   . Sleep apnea    Cpap machine    Past Surgical History:  Procedure Laterality Date  . HERNIA REPAIR(Left inguinal hernia repair)    . LIMB SPARING RESECTION HIP W/ SADDLE JOINT REPLACEMENT     1977  . SHOULDER ARTHROSCOPY     left    Family History  Problem Relation Age of Onset  . Alzheimer's disease Mother   . Cancer Father   . Cancer Sister   . Colon cancer Neg Hx    Social History:  reports that he has quit smoking. He has quit using smokeless tobacco. He reports that he does not drink alcohol or use drugs.  Allergies: No Known Allergies  Medications Prior to Admission  Medication Sig Dispense Refill  . acetaminophen (TYLENOL) 500 MG tablet Take 1,000-1,500 mg by mouth every 6 (six) hours as needed (for pain.).     Marland Kitchen albuterol (PROVENTIL HFA;VENTOLIN HFA) 108 (90 Base) MCG/ACT inhaler Inhale 1-2 puffs into the lungs every 6 (six) hours as needed for wheezing or shortness of breath.    Marland Kitchen albuterol (PROVENTIL) (2.5 MG/3ML) 0.083% nebulizer solution Take 2.5 mg by nebulization every 6 (six) hours as needed for wheezing or shortness of breath.    . fluticasone (FLONASE) 50 MCG/ACT nasal spray Place 1 spray into both nostrils daily.    . INCRUSE ELLIPTA 62.5 MCG/INH AEPB Inhale 1 puff into the lungs daily.    . ranitidine (ZANTAC) 150 MG tablet Take 150 mg by mouth daily.    . rosuvastatin (CRESTOR) 20 MG tablet Take 20 mg by mouth daily.    . vitamin B-12 (CYANOCOBALAMIN) 500 MCG tablet Take 500 mcg by mouth daily.      No results found for this or any  previous visit (from the past 48 hour(s)). No results found.  ROS  Blood pressure (!) 148/75, pulse 69, temperature 97.7 F (36.5 C), temperature source Oral, resp. rate (!) 21, height 6' (1.829 m), weight 162 lb (73.5 kg), SpO2 97 %. Physical Exam  Constitutional:  Well-developed thin Caucasian male in NAD.  HENT:  Mouth/Throat: Oropharynx is clear and moist.  Evidence of skin graft at nasal tip  Eyes: Conjunctivae are normal. No scleral icterus.  Neck: No thyromegaly present.  Cardiovascular: Normal rate, regular rhythm and normal heart sounds.   No murmur heard. Respiratory: Effort normal and breath sounds normal.  GI:  Abdomen is flat. Scar noted across upper abdomen as well as in the left inguinal region. Abdomen is soft and nontender without organomegaly or masses.  Musculoskeletal: He exhibits no edema.  Lymphadenopathy:    He has no cervical adenopathy.  Neurological: He is alert.  Skin: Skin is warm and dry.     Assessment/Plan Average risk screening colonoscopy.  Hildred Laser, MD 03/31/2017, 9:07 AM

## 2017-04-05 ENCOUNTER — Encounter (HOSPITAL_COMMUNITY): Payer: Self-pay | Admitting: Internal Medicine

## 2017-05-04 NOTE — Progress Notes (Signed)
Results were reviewed and found to be:within normal limits  No acute injury or pathology identified  Review would suggest no procedures needed at this time

## 2017-05-04 NOTE — Progress Notes (Signed)
Results were reviewed and found to be: abnormal  Further testing may be useful  Review would suggest interventional pain management techniques may be of benefit

## 2017-05-04 NOTE — Progress Notes (Signed)
Results were reviewed and found to be: mildly abnormal  No acute injury or pathology identified  Review would suggest interventional pain management techniques may be of benefit 

## 2017-05-04 NOTE — Progress Notes (Signed)
Results were reviewed and found to be: abnormal  No acute injury or pathology identified  Review would suggest interventional pain management techniques may be of benefit

## 2018-08-14 IMAGING — CR DG SI JOINTS 3+V
1 series · 3 of 3 positions shown · non-contrast
Comparison: None.

CLINICAL DATA: Chronic pain

EXAM:
BILATERAL SACROILIAC JOINTS - 3+ VIEW

[Series 1: dg si joints · 0.14mm/px · 3 of 3 slices shown]
[im 1/3]
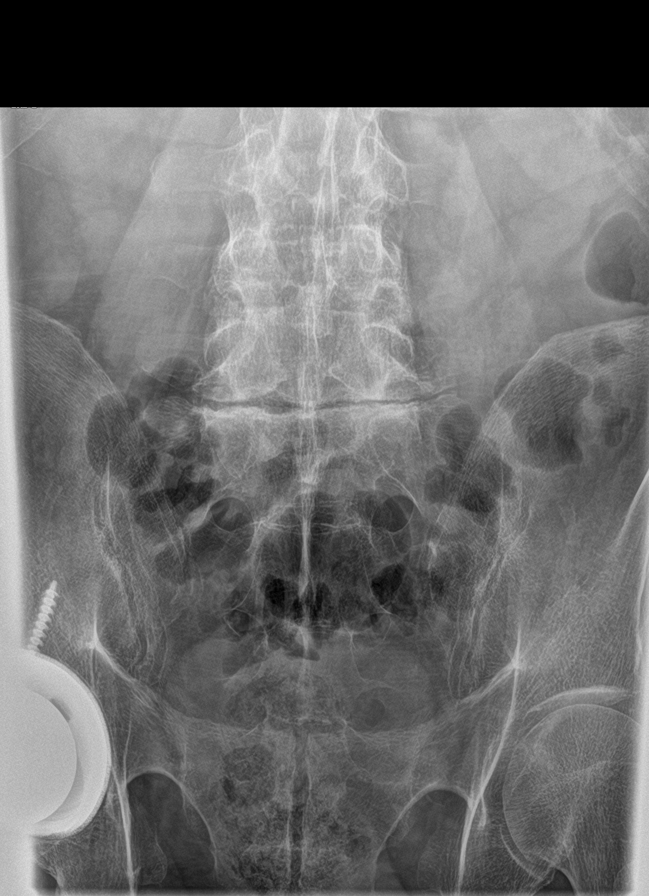
[im 2/3]
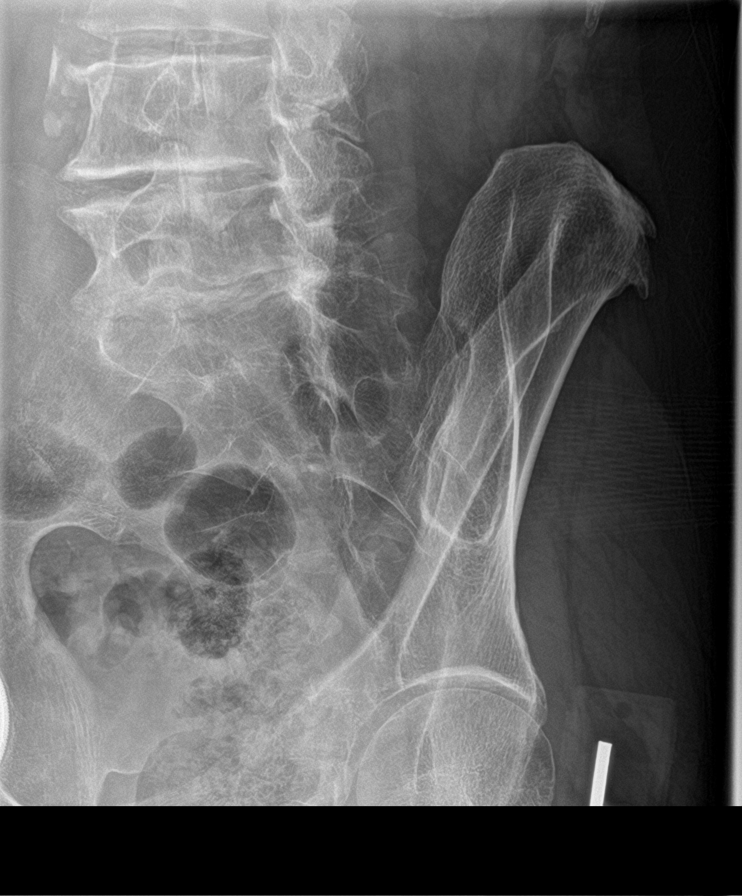
[im 3/3]
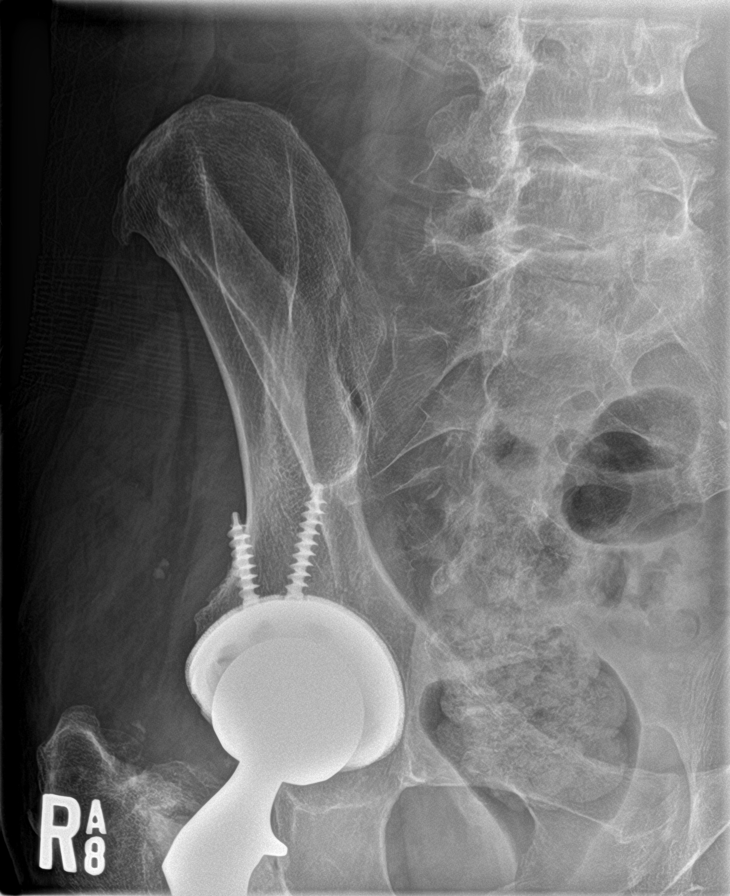

[3 of 3 positions shown; findings below may reference images not displayed]

FINDINGS: The sacrum is partially obscured by overlying bowel contents. No
obvious fractures. The SI joints are unremarkable. Degenerative
changes seen in the lower lumbar spine.
IMPRESSION: Negative.

## 2018-08-14 IMAGING — CR DG HIP (WITH OR WITHOUT PELVIS) 2-3V*L*
1 series · 3 of 3 positions shown · non-contrast
Comparison: None.

CLINICAL DATA: Left hip pain.

EXAM:
DG HIP (WITH OR WITHOUT PELVIS) 2-3V LEFT

[Series 1: dg hip unilat w or w/o pelvis 2-3 views  · non-contrast · 0.14mm/px · 3 of 3 slices shown]
[im 1/3]
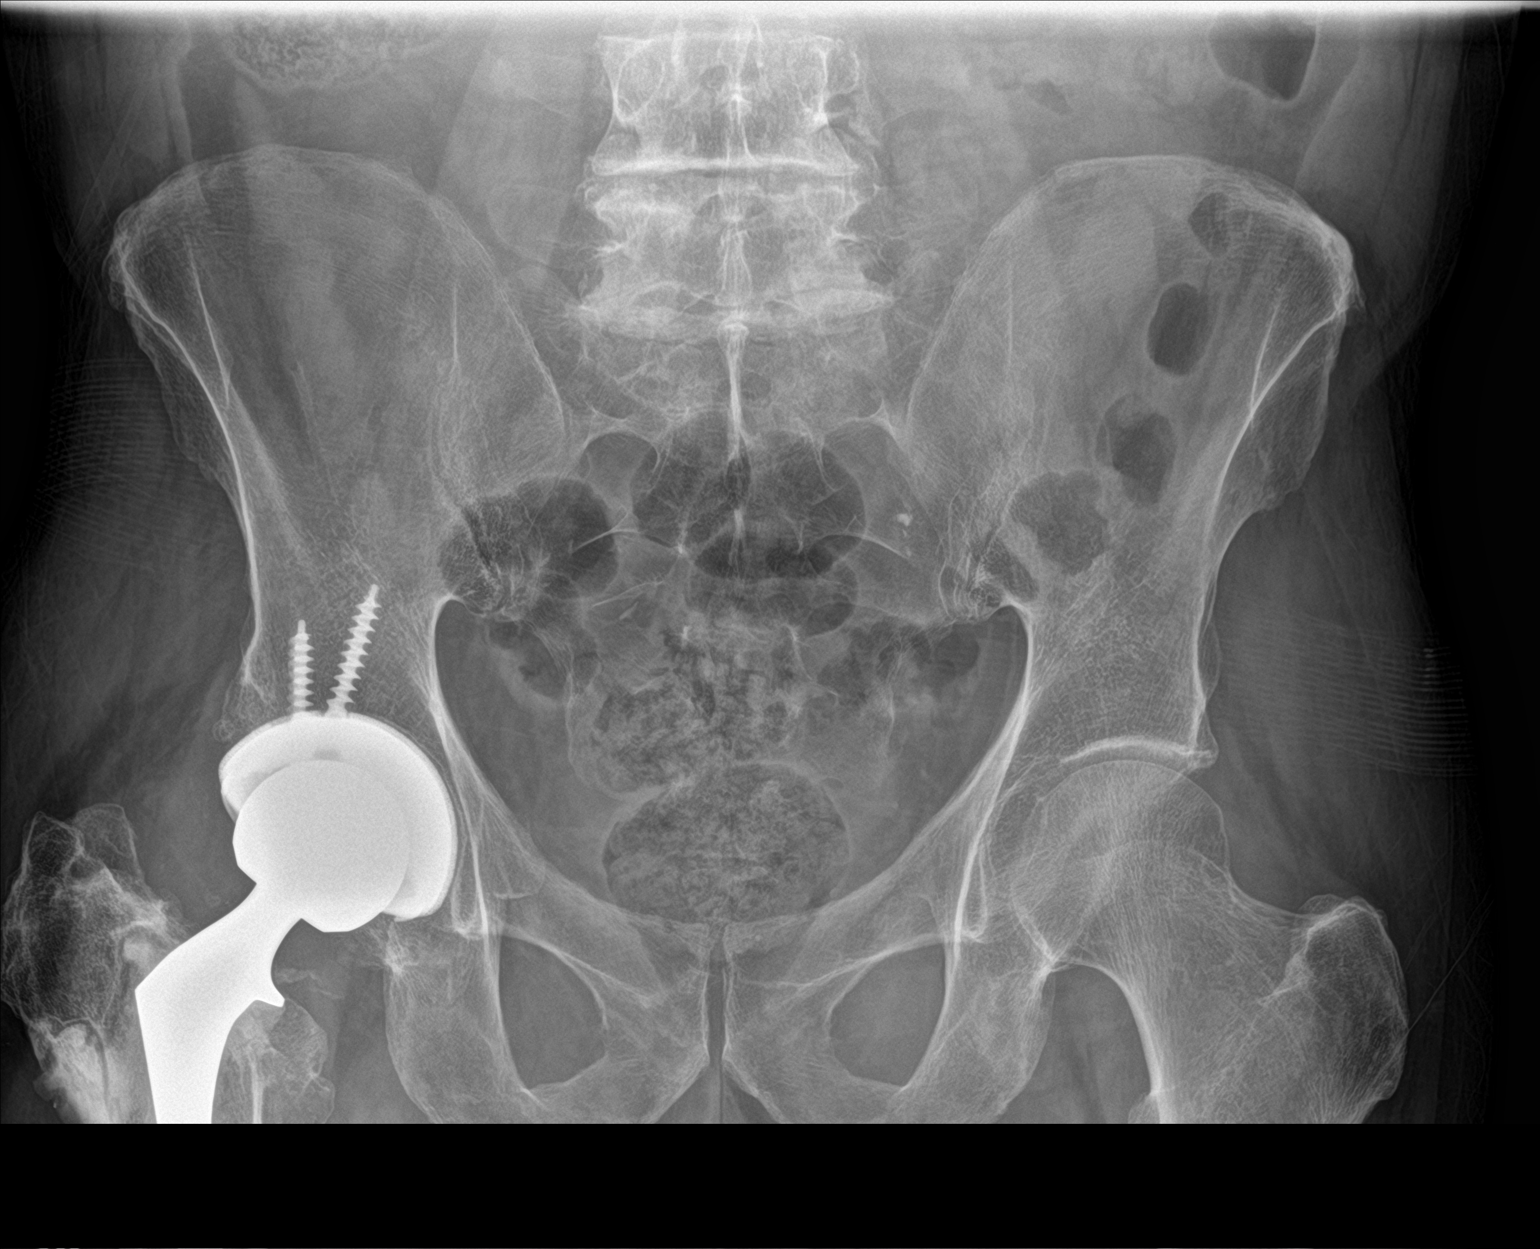
[im 2/3]
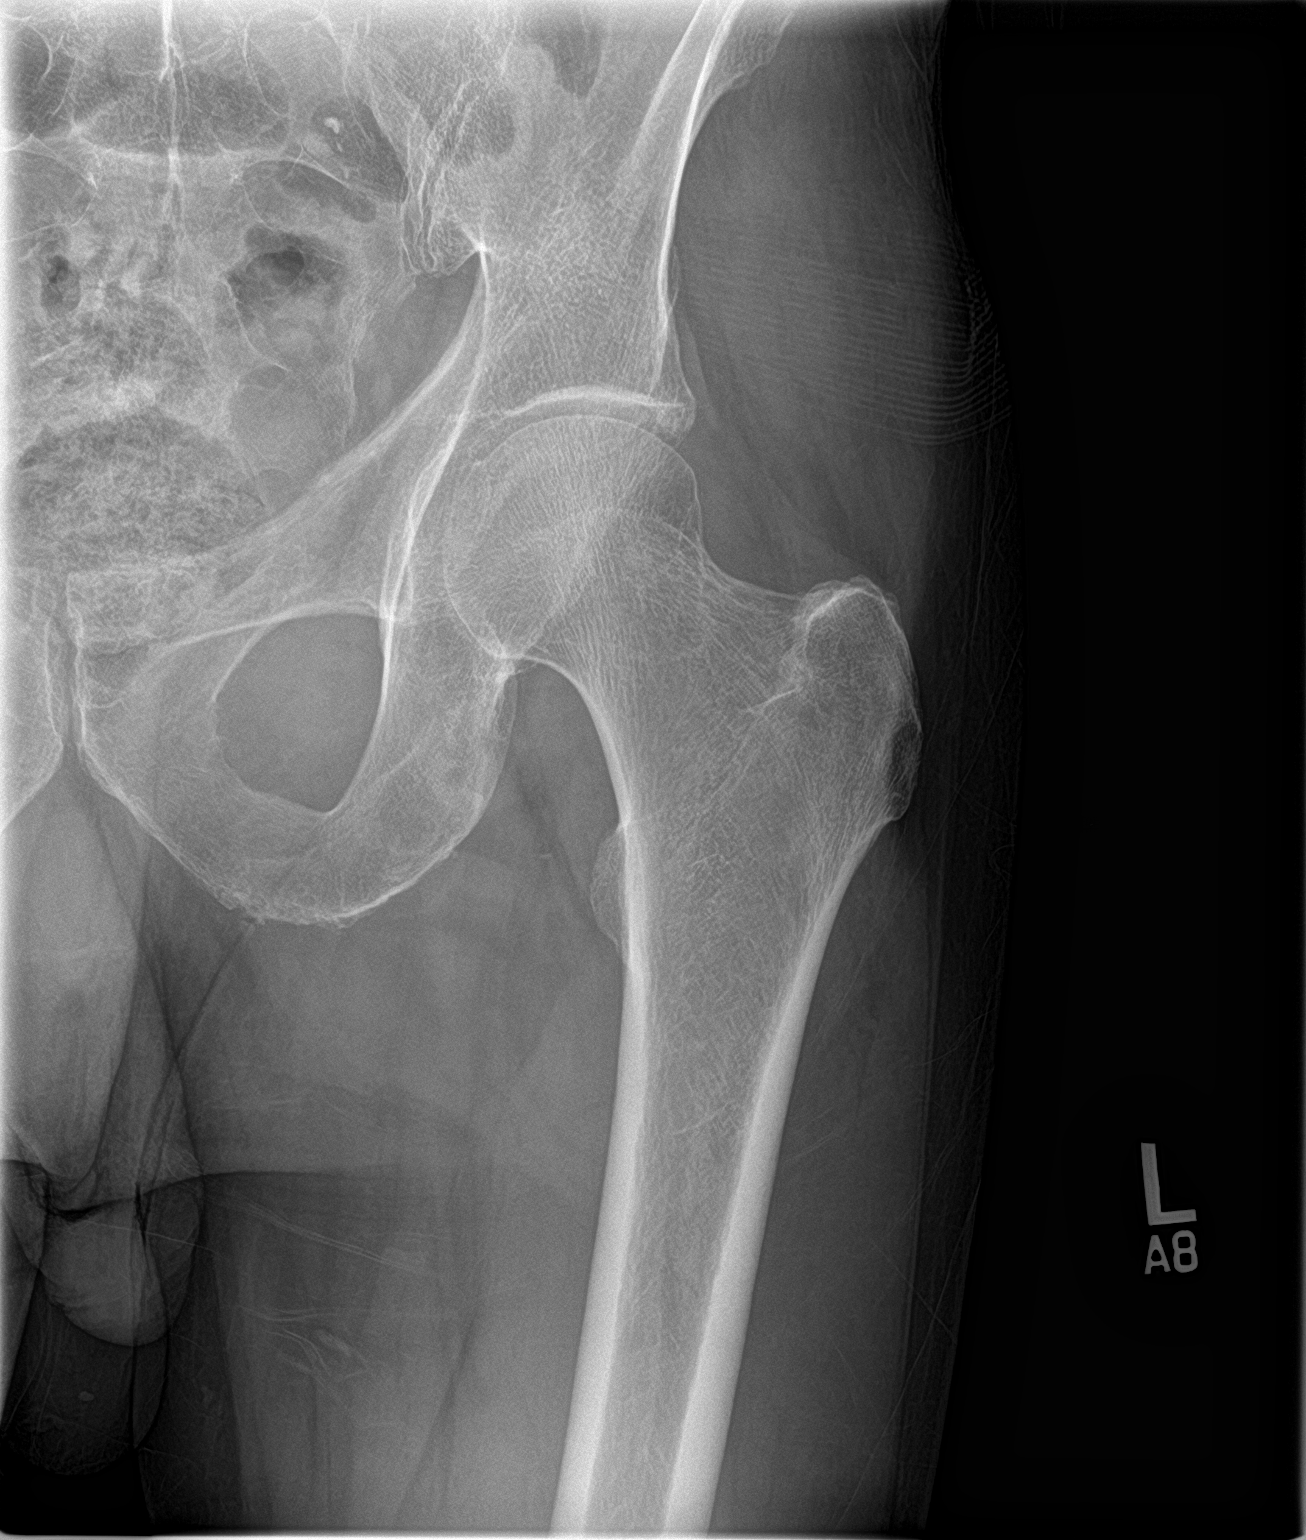
[im 3/3]
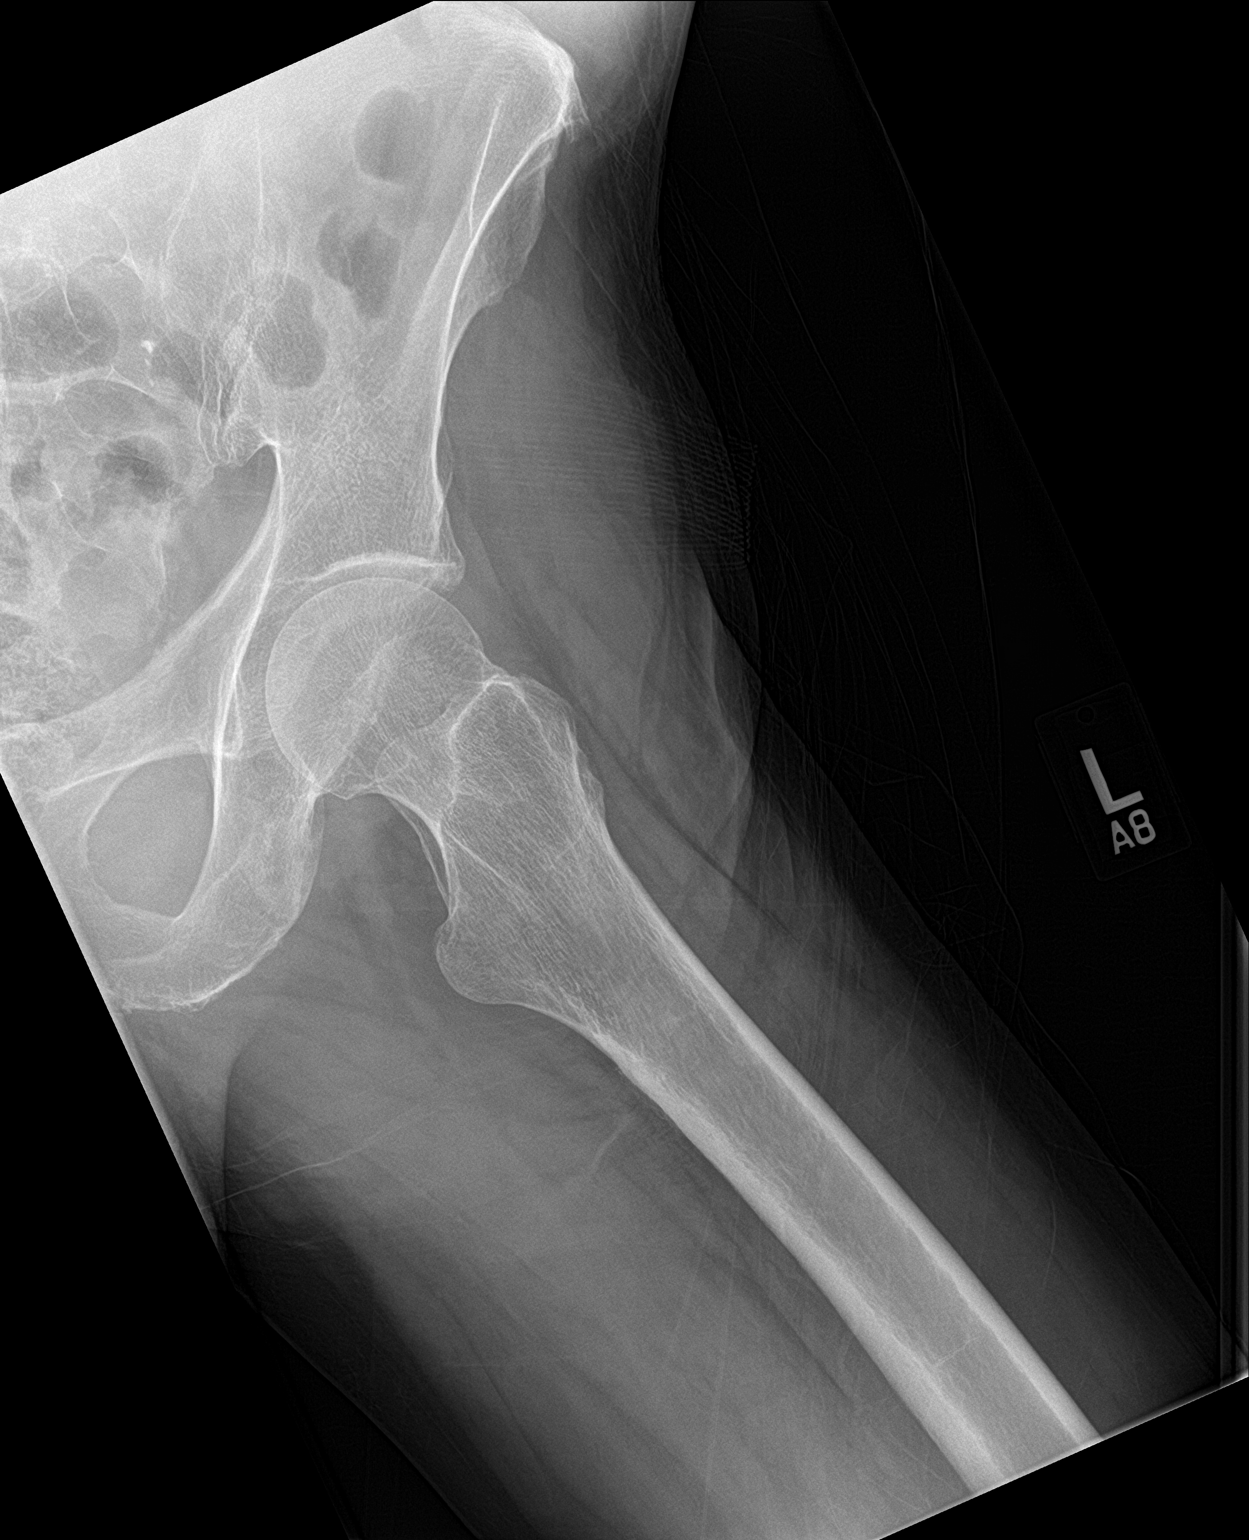

[3 of 3 positions shown; findings below may reference images not displayed]

FINDINGS: The left hip is normally located. Mild to moderate degenerative
changes but no fracture or AVN. The pubic symphysis and SI joints
appear intact. Mild degenerative changes. No definite pelvic
fractures the right hip prosthesis appears intact.
IMPRESSION: Mild to moderate left hip joint degenerative changes but no fracture
or AVN.

## 2018-08-14 IMAGING — CR DG KNEE 1-2V*R*
1 series · 2 of 2 positions shown · non-contrast
Comparison: None.

CLINICAL DATA: Chronic pain.

EXAM:
RIGHT KNEE - 1-2 VIEW

[Series 1: dg knee 1-2 views right · 0.14mm/px · 2 of 2 slices shown]
[im 1/2]
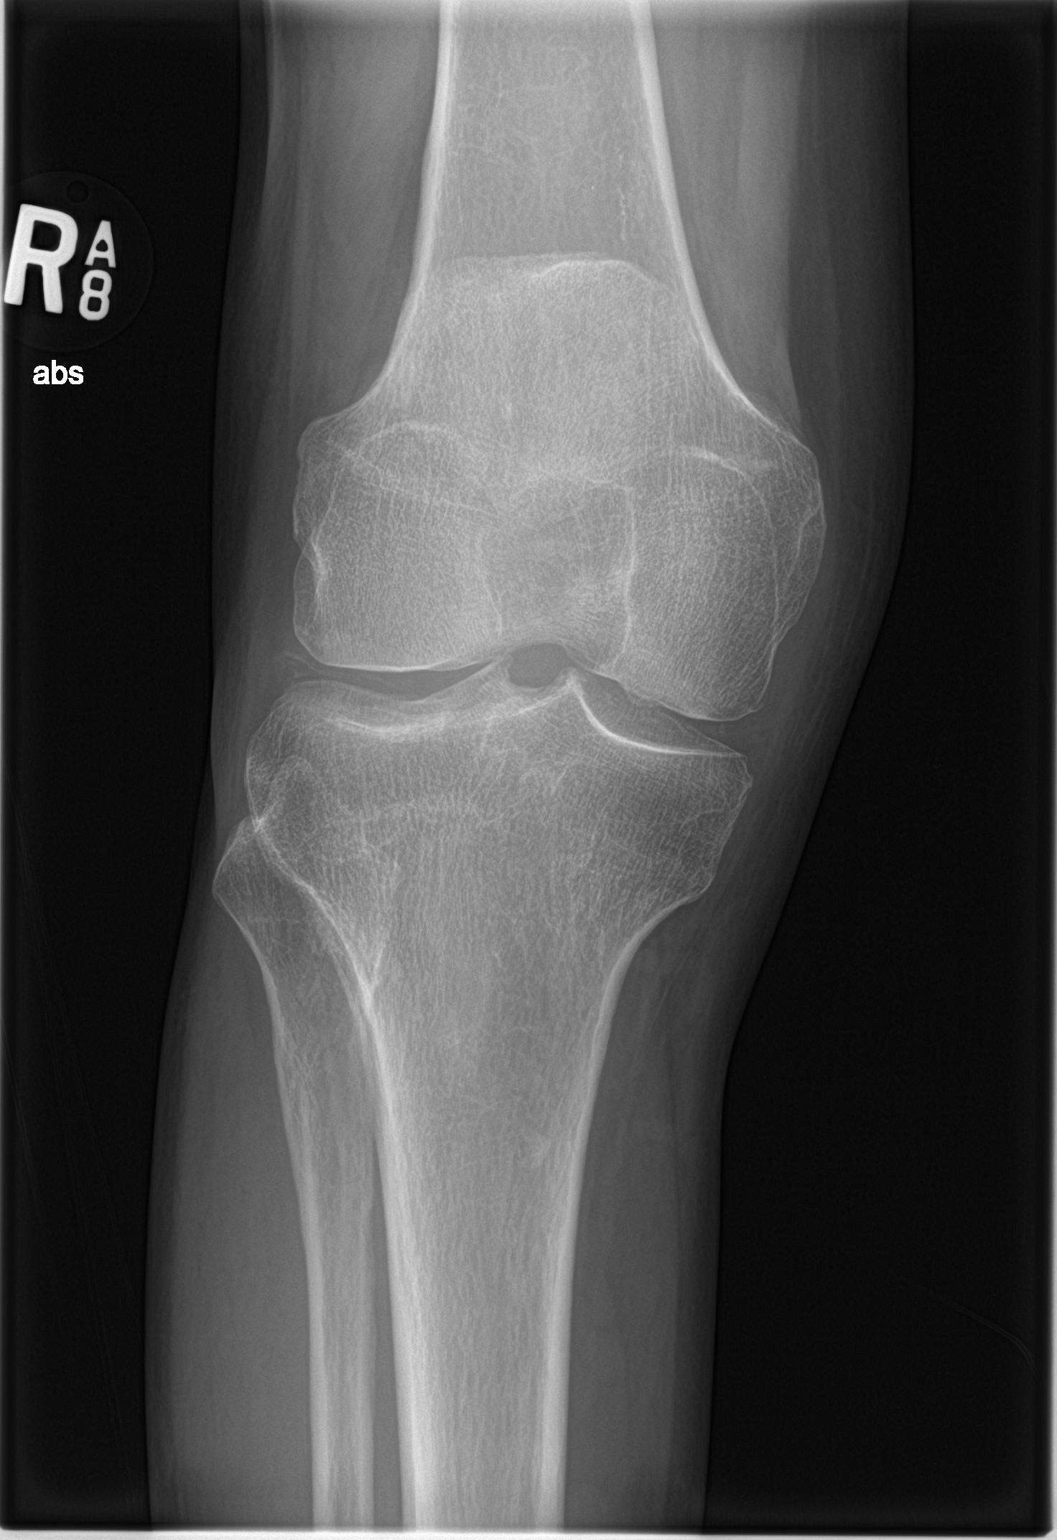
[im 2/2]
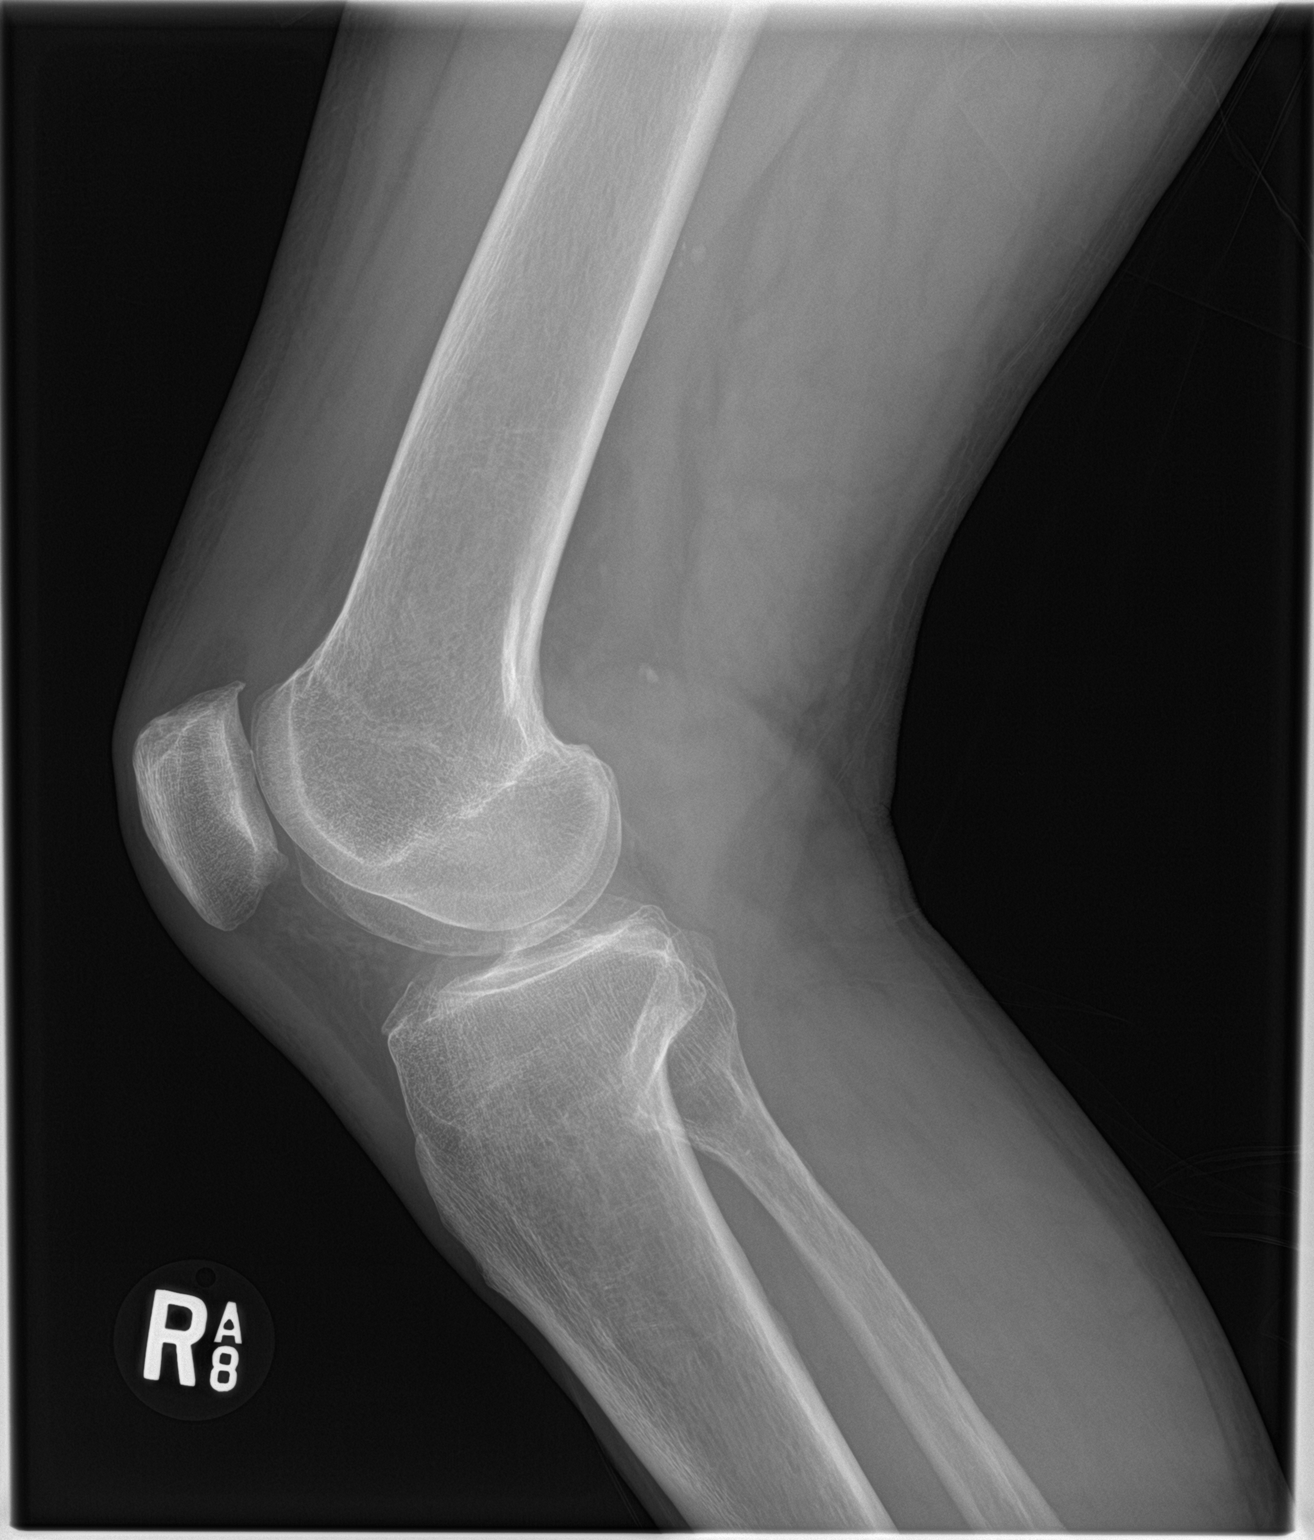

[2 of 2 positions shown; findings below may reference images not displayed]

FINDINGS: Chondrocalcinosis is seen, particularly laterally. Mild
patellofemoral degenerative changes. No joint effusion. No
fractures. No other acute abnormalities.
IMPRESSION: Chondrocalcinosis consistent with CPPD. Mild patellofemoral
degenerative change.

## 2018-08-14 IMAGING — CR DG LUMBAR SPINE COMPLETE W/ BEND
1 series · 7 of 7 positions shown · non-contrast
Comparison: None.

CLINICAL DATA: Chronic pain

EXAM:
LUMBAR SPINE - COMPLETE WITH BENDING VIEWS

[Series 1: dg lumbar spine complete w/bend 6+v · 0.14mm/px · 7 of 7 slices shown]
[im 1/7]
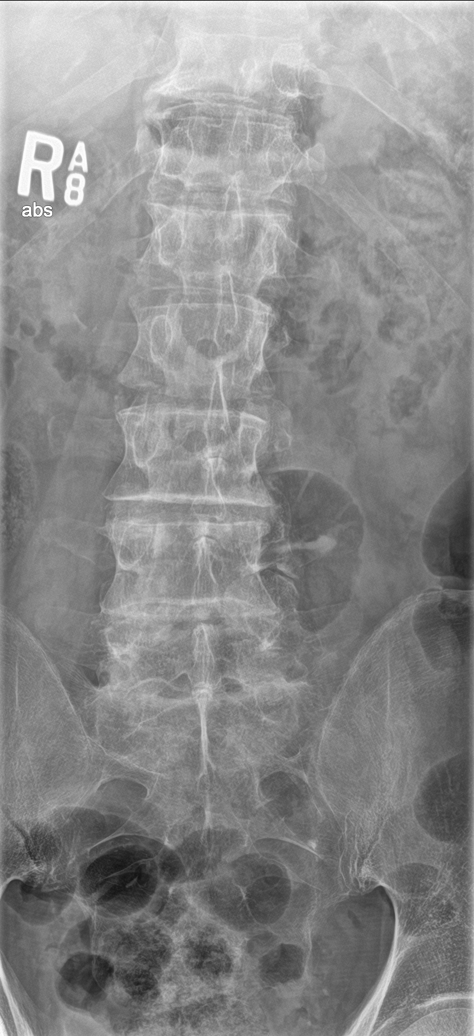
[im 2/7]
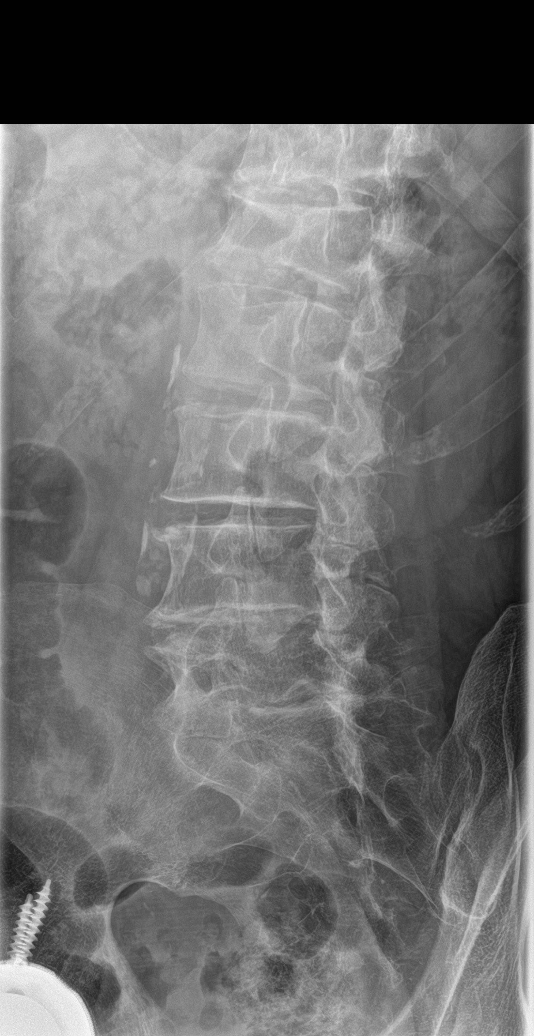
[im 3/7]
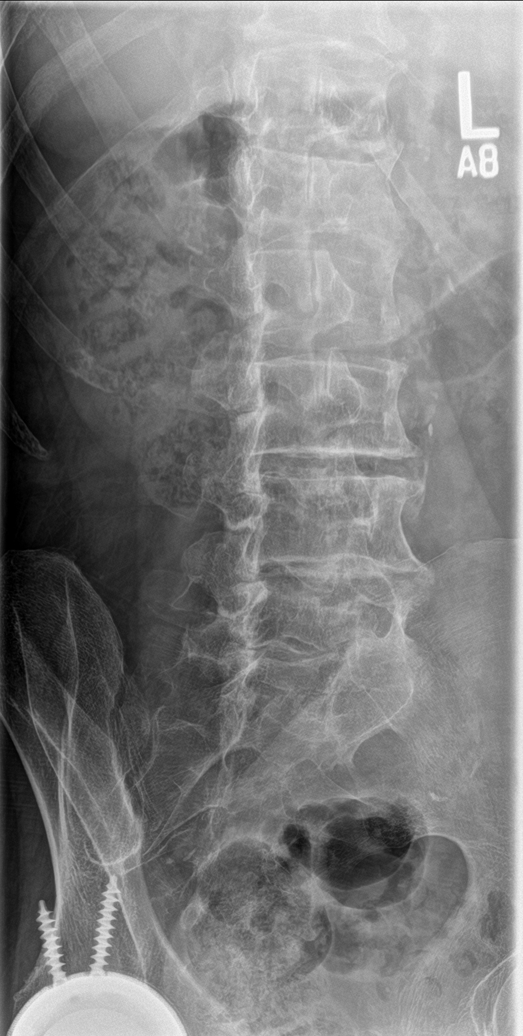
[im 4/7]
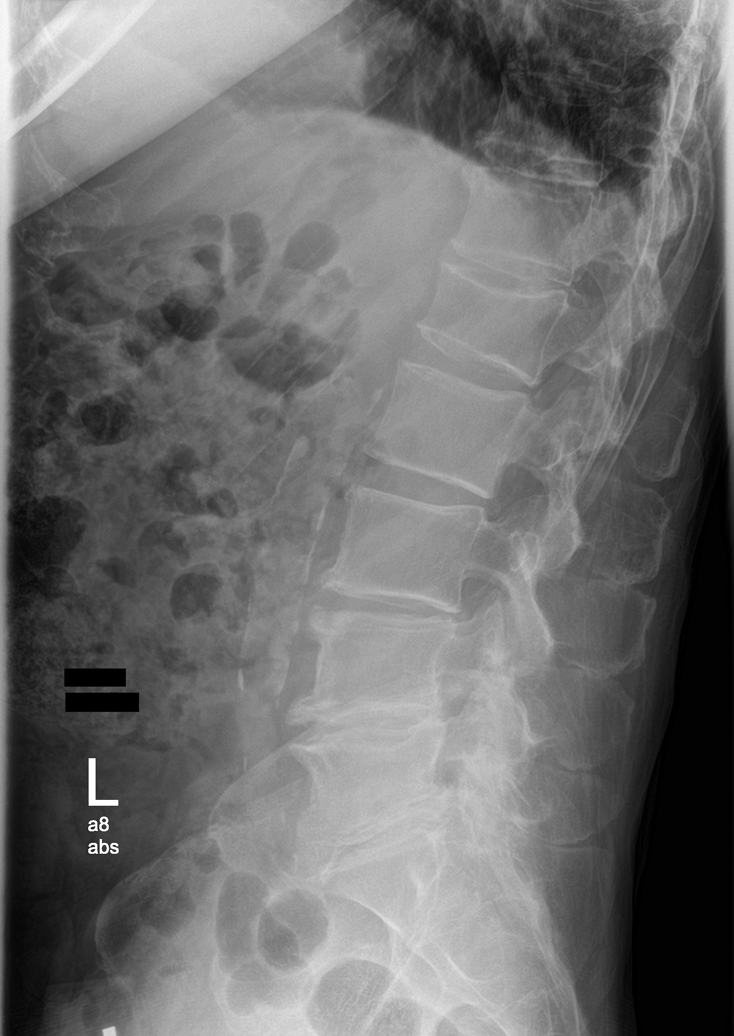
[im 5/7]
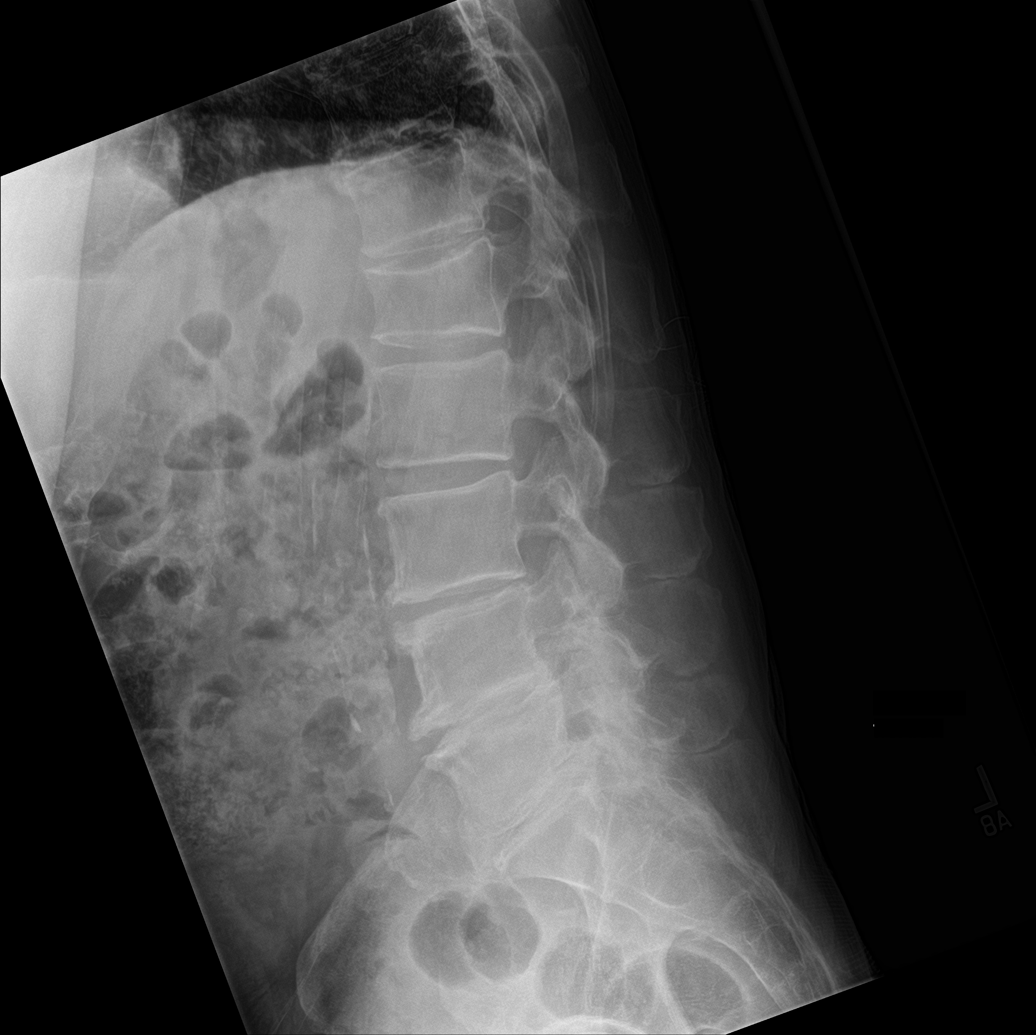
[im 6/7]
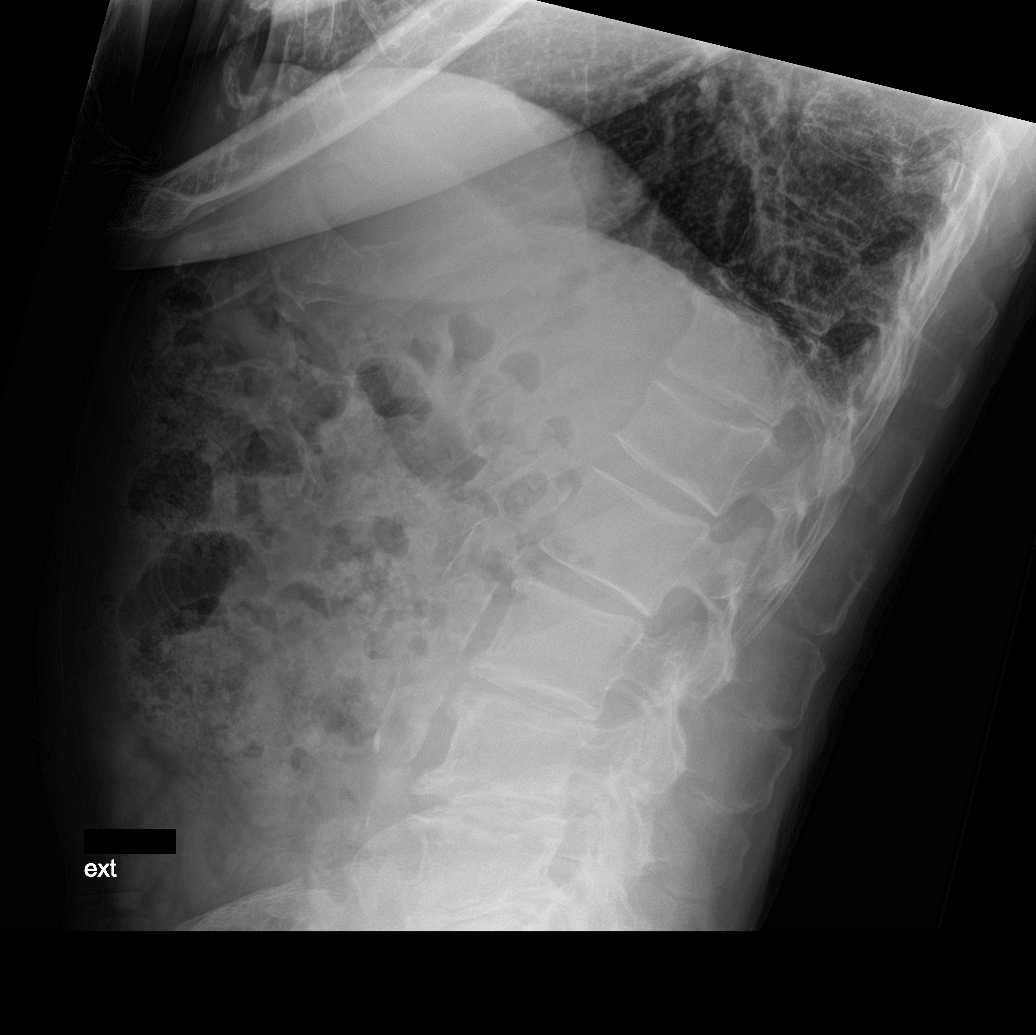
[im 7/7]
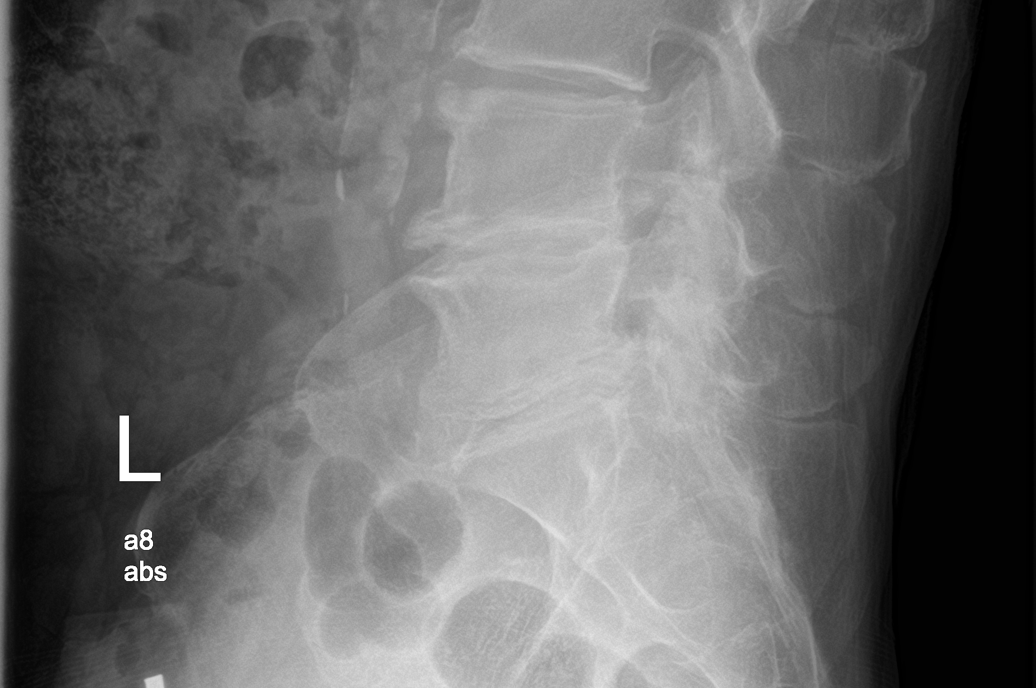

[7 of 7 positions shown; findings below may reference images not displayed]

FINDINGS: Minimal anterior wedging of L1. No other evidence of fracture. No
suspicious malalignment. Degenerative changes most marked in the
lower lumbar and lumbosacral spine. Lower lumbar facet degenerative
changes. No listhesis with flexion or extension.
IMPRESSION: 1. Mild anterior wedging of L1 is age indeterminate but may not be
acute given history. Recommend clinical correlation.
2. Degenerative changes, particularly in the lower lumbar spine and
lumbosacral spine. Lower lumbar facet degenerative changes.
3. No instability seen with flexion or extension.

## 2018-08-14 IMAGING — CR DG SHOULDER 2+V*L*
1 series · 3 of 3 positions shown · non-contrast
Comparison: None.

CLINICAL DATA: Chronic pain

EXAM:
LEFT SHOULDER - 2+ VIEW

[Series 1: dg shoulder left · 0.14mm/px · 3 of 3 slices shown]
[im 1/3]
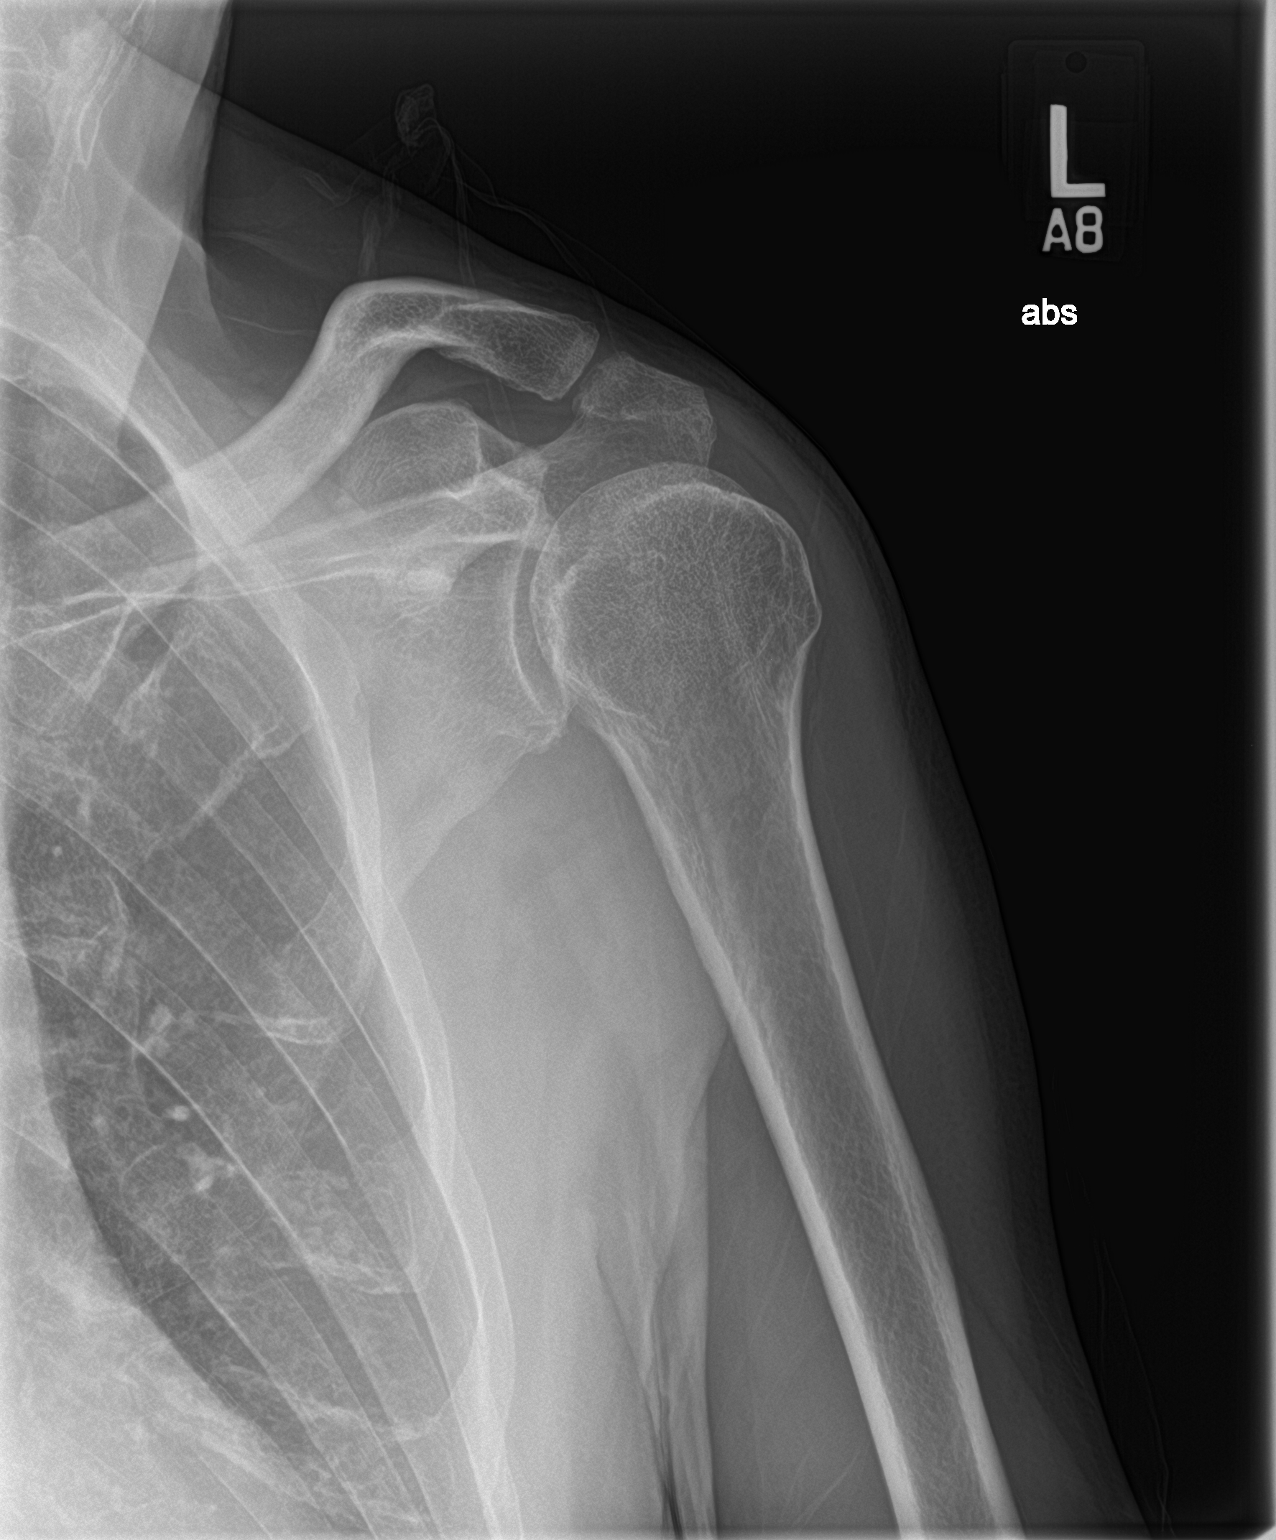
[im 2/3]
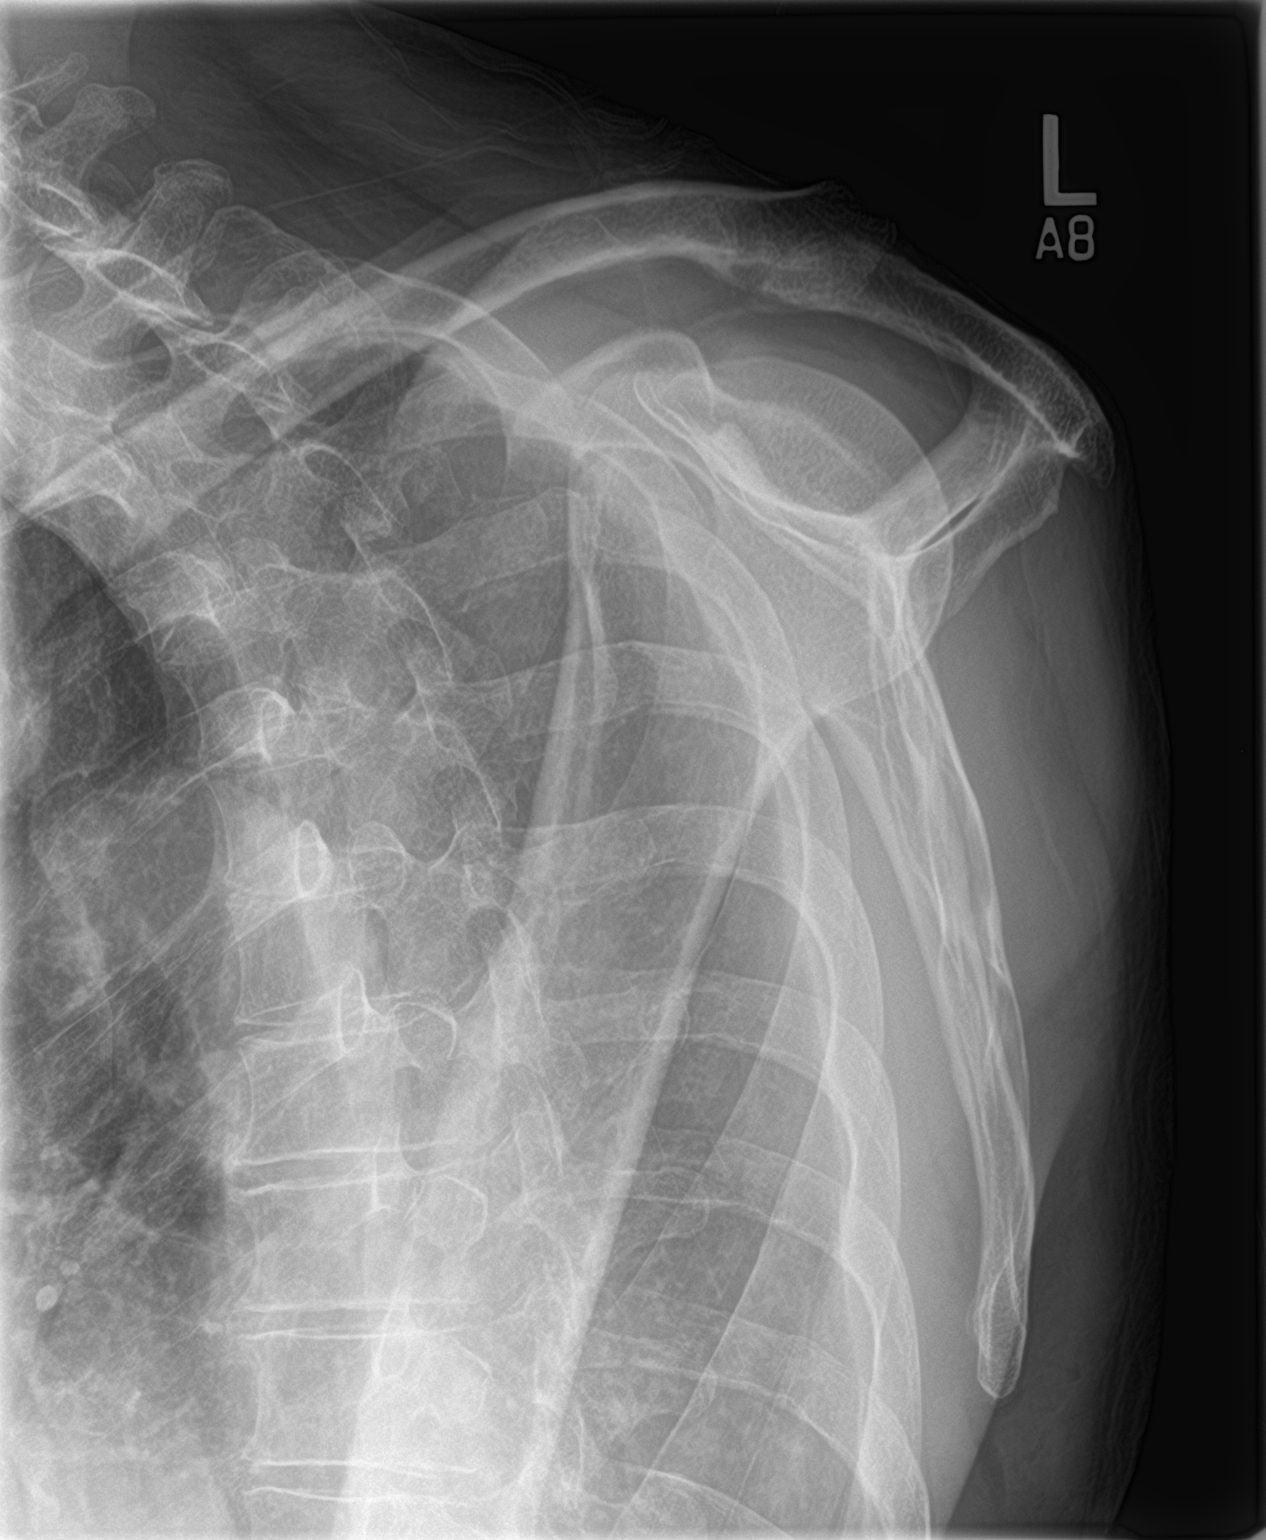
[im 3/3]
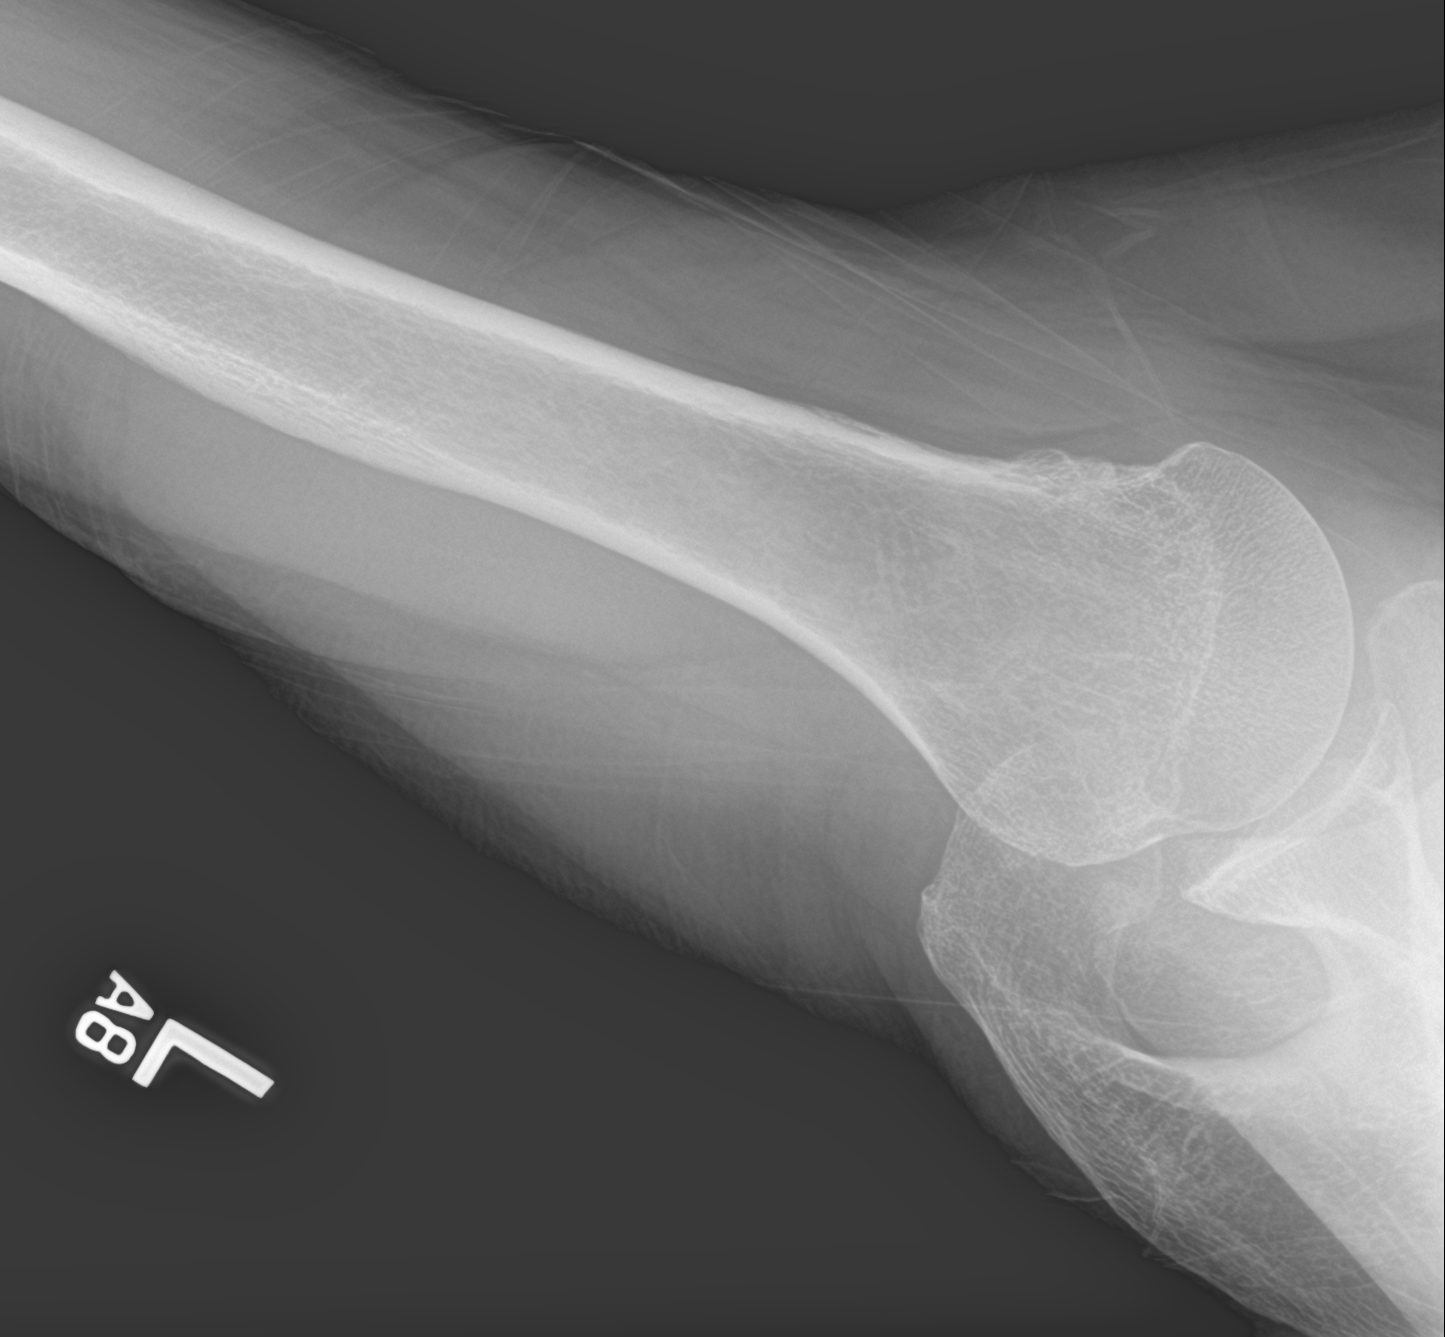

[3 of 3 positions shown; findings below may reference images not displayed]

FINDINGS: Mild degenerative changes.  No other abnormalities.
IMPRESSION: Mild degenerative changes.

## 2022-03-13 ENCOUNTER — Encounter (INDEPENDENT_AMBULATORY_CARE_PROVIDER_SITE_OTHER): Payer: Self-pay | Admitting: *Deleted
